# Patient Record
Sex: Male | Born: 1980 | Race: Black or African American | Hispanic: No | Marital: Married | Smoking: Never smoker
Health system: Southern US, Community
[De-identification: ages and names within clinical notes are randomized; demographics above are authoritative.]

## PROBLEM LIST (undated history)

## (undated) DIAGNOSIS — E119 Type 2 diabetes mellitus without complications: Secondary | ICD-10-CM

## (undated) DIAGNOSIS — E66811 Obesity, class 1: Secondary | ICD-10-CM

## (undated) DIAGNOSIS — E669 Obesity, unspecified: Secondary | ICD-10-CM

---

## 2006-04-08 ENCOUNTER — Emergency Department (HOSPITAL_COMMUNITY): Admission: EM | Admit: 2006-04-08 | Discharge: 2006-04-08 | Payer: Self-pay | Admitting: Emergency Medicine

## 2006-10-14 ENCOUNTER — Emergency Department (HOSPITAL_COMMUNITY): Admission: EM | Admit: 2006-10-14 | Discharge: 2006-10-15 | Payer: Self-pay | Admitting: Emergency Medicine

## 2007-03-27 ENCOUNTER — Emergency Department (HOSPITAL_COMMUNITY): Admission: EM | Admit: 2007-03-27 | Discharge: 2007-03-27 | Payer: Self-pay | Admitting: Emergency Medicine

## 2009-04-15 ENCOUNTER — Emergency Department (HOSPITAL_COMMUNITY): Admission: EM | Admit: 2009-04-15 | Discharge: 2009-04-15 | Payer: Self-pay | Admitting: Emergency Medicine

## 2011-10-19 ENCOUNTER — Encounter: Payer: Self-pay | Admitting: *Deleted

## 2011-10-19 ENCOUNTER — Emergency Department (HOSPITAL_COMMUNITY)
Admission: EM | Admit: 2011-10-19 | Discharge: 2011-10-19 | Disposition: A | Discharge status: Home / Self Care | Payer: Self-pay | Attending: Emergency Medicine | Admitting: Emergency Medicine

## 2011-10-19 DIAGNOSIS — R599 Enlarged lymph nodes, unspecified: Secondary | ICD-10-CM | POA: Insufficient documentation

## 2011-10-19 DIAGNOSIS — J029 Acute pharyngitis, unspecified: Secondary | ICD-10-CM | POA: Insufficient documentation

## 2011-10-19 LAB — RAPID STREP SCREEN (MED CTR MEBANE ONLY): Streptococcus, Group A Screen (Direct): NEGATIVE

## 2011-10-19 MED ORDER — HYDROCODONE-ACETAMINOPHEN 5-325 MG PO TABS: 1.0000 | ORAL_TABLET | ORAL | Status: AC | PRN

## 2011-10-19 MED ORDER — DEXAMETHASONE 6 MG PO TABS
12.0000 mg | ORAL_TABLET | Freq: Once | ORAL | Status: AC
  Administered 2011-10-19: 12 mg via ORAL
  Filled 2011-10-19: qty 2

## 2011-10-19 NOTE — ED Provider Notes (Signed)
History     CSN: [id number] Arrival date & time: 10/19/2011  1:24 AM   First MD Initiated Contact with Patient 10/19/11 0715      Chief Complaint  Patient presents with  . Sore Throat    (Consider location/radiation/quality/duration/timing/severity/associated sxs/prior treatment) Patient is a 30 y.o. male presenting with pharyngitis. The history is provided by the patient.  Sore Throat   sore throat started 3 days ago. Pain is moderate to severe currently rated 7/10. Pain is worse when he swallows. Nothing makes it better. He has not done anything to try and help it. There has been associated fever to 101.2. He has not had any chills or sweats. There's been no rhinorrhea. He has had a slight cough which has been nonproductive. There's been mild nausea without vomiting or diarrhea. No arthralgias or myalgias. He denies any sick contacts.  History reviewed. No pertinent past medical history.  History reviewed. No pertinent past surgical history.  History reviewed. No pertinent family history.  History  Substance Use Topics  . Smoking status: Not on file  . Smokeless tobacco: Not on file  . Alcohol Use: Not on file      Review of Systems  All other systems reviewed and are negative.    Allergies  Review of patient's allergies indicates no known allergies.  Home Medications   Current Outpatient Rx  Name Route Sig Dispense Refill  . HYDROCODONE-ACETAMINOPHEN 5-325 MG PO TABS Oral Take 1 tablet by mouth every 4 (four) hours as needed for pain. 12 tablet 0    BP 110/47  Pulse 78  Temp(Src) 99.8 F (37.7 C) (Oral)  Resp 18  Ht 6\' 1"  (1.854 m)  Wt 250 lb (113.399 kg)  BMI 32.98 kg/m2  SpO2 98%  Physical Exam  Nursing note and vitals reviewed.  30 year old male resting comfortably and in no acute distress. Vital signs are significant for low-grade fever of 100.1. Head is normocephalic and atraumatic. PERRLA, EOMI. TMs are clear. Oropharynx is moderate erythema  without exudate or tonsillar hypertrophy. Neck is supple with shotty anterior cervical and submandibular adenopathy. Back is nontender. Lungs are clear without rales, wheezes, rhonchi. Heart has regular rate and rhythm without murmur. There is no chest wall tenderness. Abdomen is soft, flat, nontender without masses or hepatosplenomegaly. Extremities have no cyanosis or edema, full range of motion present. Skin is warm and moist without rash. Neurologic: Mental status is normal, cranial nerves are intact, there are no motor or sensory deficits. Psychiatric: Normal mood and affect.  ED Course  Procedures (including critical care time)   Labs Reviewed  RAPID STREP SCREEN   No results found.  Results for orders placed during the hospital encounter of 10/19/11  RAPID STREP SCREEN      Component Value Range   Streptococcus, Group A Screen (Direct) NEGATIVE  NEGATIVE    No results found.   1. Pharyngitis       MDM  Pharyngitis - probably viral.        Dione Booze, MD 10/19/11 [phone]

## 2011-10-19 NOTE — ED Notes (Signed)
Patient remains in triage at this time. Patient has had a decrease in temperature. Is sleeping. Remains with sore throat

## 2011-10-19 NOTE — ED Notes (Signed)
Pt states that he began to experience a sore throat, headache, neck ache, body aches, fever, and nausea at approximately 0530 yesterday morning.

## 2014-06-24 ENCOUNTER — Emergency Department (HOSPITAL_COMMUNITY): Payer: Worker's Compensation

## 2014-06-24 ENCOUNTER — Emergency Department (HOSPITAL_COMMUNITY)
Admission: EM | Admit: 2014-06-24 | Discharge: 2014-06-24 | Disposition: A | Discharge status: Home / Self Care | Payer: Worker's Compensation | Attending: Emergency Medicine | Admitting: Emergency Medicine

## 2014-06-24 ENCOUNTER — Encounter (HOSPITAL_COMMUNITY): Payer: Self-pay | Admitting: Emergency Medicine

## 2014-06-24 DIAGNOSIS — Y9389 Activity, other specified: Secondary | ICD-10-CM | POA: Insufficient documentation

## 2014-06-24 DIAGNOSIS — W230XXA Caught, crushed, jammed, or pinched between moving objects, initial encounter: Secondary | ICD-10-CM | POA: Diagnosis not present

## 2014-06-24 DIAGNOSIS — S61409A Unspecified open wound of unspecified hand, initial encounter: Secondary | ICD-10-CM | POA: Diagnosis not present

## 2014-06-24 DIAGNOSIS — Y9289 Other specified places as the place of occurrence of the external cause: Secondary | ICD-10-CM | POA: Insufficient documentation

## 2014-06-24 DIAGNOSIS — S61412A Laceration without foreign body of left hand, initial encounter: Secondary | ICD-10-CM

## 2014-06-24 MED ORDER — OXYCODONE-ACETAMINOPHEN 5-325 MG PO TABS
1.0000 | ORAL_TABLET | Freq: Once | ORAL | Status: AC
  Administered 2014-06-24: 1 via ORAL
  Filled 2014-06-24: qty 1

## 2014-06-24 MED ORDER — MORPHINE SULFATE 4 MG/ML IJ SOLN
4.0000 mg | Freq: Once | INTRAMUSCULAR | Status: AC
  Administered 2014-06-24: 4 mg via INTRAVENOUS
  Filled 2014-06-24: qty 1

## 2014-06-24 MED ORDER — SODIUM CHLORIDE 0.9 % IV BOLUS (SEPSIS)
1000.0000 mL | Freq: Once | INTRAVENOUS | Status: AC
  Administered 2014-06-24: 1000 mL via INTRAVENOUS

## 2014-06-24 MED ORDER — LIDOCAINE-EPINEPHRINE 2 %-1:100000 IJ SOLN
20.0000 mL | Freq: Once | INTRAMUSCULAR | Status: AC
  Administered 2014-06-24: 20 mL via INTRADERMAL
  Filled 2014-06-24: qty 1

## 2014-06-24 MED ORDER — LIDOCAINE-EPINEPHRINE-TETRACAINE (LET) SOLUTION: 3.0000 mL | Freq: Once | NASAL | Status: DC

## 2014-06-24 MED ORDER — CEFAZOLIN SODIUM 1-5 GM-% IV SOLN
1.0000 g | Freq: Once | INTRAVENOUS | Status: AC
  Administered 2014-06-24: 1 g via INTRAVENOUS
  Filled 2014-06-24: qty 50

## 2014-06-24 MED ORDER — CEPHALEXIN 250 MG PO CAPS: 250.0000 mg | ORAL_CAPSULE | Freq: Four times a day (QID) | ORAL | Status: DC

## 2014-06-24 MED ORDER — ONDANSETRON HCL 4 MG/2ML IJ SOLN
4.0000 mg | Freq: Once | INTRAMUSCULAR | Status: AC
  Administered 2014-06-24: 4 mg via INTRAVENOUS
  Filled 2014-06-24: qty 2

## 2014-06-24 MED ORDER — HYDROCODONE-ACETAMINOPHEN 5-325 MG PO TABS: 2.0000 | ORAL_TABLET | ORAL | Status: DC | PRN

## 2014-06-24 NOTE — ED Provider Notes (Signed)
CSN: [id number]     Arrival date & time 06/24/14  [id number] History   First MD Initiated Contact with Patient 06/24/14 0140     Chief Complaint  Patient presents with  . Extremity Laceration     (Consider location/radiation/quality/duration/timing/severity/associated sxs/prior Treatment) HPI Comments: Hand was caught in conveyer belt presents with laceration to palm of left hand with uncontrolled bleeding despite pressure   The history is provided by the patient.    History reviewed. No pertinent past medical history. History reviewed. No pertinent past surgical history. No family history on file. History  Substance Use Topics  . Smoking status: Never Smoker   . Smokeless tobacco: Not on file  . Alcohol Use: No    Review of Systems  Skin: Positive for pallor and wound.  Neurological: Positive for dizziness.      Allergies  Review of patient's allergies indicates no known allergies.  Home Medications   Prior to Admission medications   Medication Sig Start Date End Date Taking? Authorizing Provider  cephALEXin (KEFLEX) 250 MG capsule Take 1 capsule (250 mg total) by mouth 4 (four) times daily. 06/24/14   Arman FilterGail K Cyncere Ruhe, NP  HYDROcodone-acetaminophen (NORCO/VICODIN) 5-325 MG per tablet Take 2 tablets by mouth every 4 (four) hours as needed. 06/24/14   Arman FilterGail K Katryn Plummer, NP   BP 137/82  Pulse 71  Resp 16  SpO2 100% Physical Exam  Nursing note reviewed. Constitutional: He is oriented to person, place, and time. He appears well-developed and well-nourished. He appears distressed.  HENT:  Head: Normocephalic.  Eyes: Pupils are equal, round, and reactive to light.  Neck: Normal range of motion.  Cardiovascular: Normal rate and regular rhythm.   Pulmonary/Chest: Effort normal and breath sounds normal.  Musculoskeletal: Normal range of motion. He exhibits tenderness. He exhibits no edema.       Left hand: He exhibits laceration. He exhibits normal range of motion, no tenderness and  normal capillary refill. Normal sensation noted. Normal strength noted.       Hands: Neurological: He is alert and oriented to person, place, and time.  Skin: Skin is warm. He is not diaphoretic.    ED Course  LACERATION REPAIR Date/Time: 06/24/2014 4:36 AM Performed by: Arman FilterSCHULZ, Kilynn Fitzsimmons K Authorized by: Arman FilterSCHULZ, Karleigh Bunte K Consent: Verbal consent obtained. written consent not obtained. Risks and benefits: risks, benefits and alternatives were discussed Consent given by: patient Patient understanding: patient states understanding of the procedure being performed Patient identity confirmed: verbally with patient Time out: Immediately prior to procedure a "time out" was called to verify the correct patient, procedure, equipment, support staff and site/side marked as required. Body area: upper extremity Location details: left hand Laceration length: 6 cm Foreign bodies: no foreign bodies Tendon involvement: none Nerve involvement: none Vascular damage: no Anesthesia: local infiltration Local anesthetic: lidocaine 2% with epinephrine Anesthetic total: 8 ml Patient sedated: no Preparation: Patient was prepped and draped in the usual sterile fashion. Irrigation solution: saline Irrigation method: syringe Amount of cleaning: extensive Debridement: none Degree of undermining: none Skin closure: 3-0 Prolene Subcutaneous closure: 4-0 Vicryl Number of sutures: 13 Technique: simple Approximation: close Approximation difficulty: complex Dressing: antibiotic ointment, 4x4 sterile gauze and gauze roll Patient tolerance: Patient tolerated the procedure well with no immediate complications.   (including critical care time) Labs Review Labs Reviewed - No data to display  Imaging Review Dg Hand Complete Left  06/24/2014   CLINICAL DATA:  Extremity laceration.  EXAM: LEFT HAND - COMPLETE 3+ VIEW  COMPARISON:  Wrist radiographs 04/15/2009  FINDINGS: There is subcutaneous gas involving the palmar  hand correlating with history of laceration. No radiopaque foreign body or fracture. Mild widening between the base of the second and third metacarpals is chronic. Extra density overlapping the distal ulnar metaphysis on the frontal projection is likely artifactual given normal appearance on the remaining views.  IMPRESSION: No acute fracture or radiopaque foreign body.   Electronically Signed   By: Tiburcio Pea M.D.   On: 06/24/2014 02:40     EKG Interpretation None      MDM   Final diagnoses:  Laceration of hand, left, initial encounter   Patient.  Vital signs are stable.  He's been ambulated in the hall has a steady gait.  Dressing has been applied until right as well to limit range of motion.  He is instructed to followup with hand surgery in 2, days.  He's been started on antibiotics, as a prophylactic preventative.  He was given 1 g of Ancef IV in the emergency department.  He is been given strict return guidelines to watch for signs of infection.  Decreased sensation, circulation, or mobility      Arman Filter, NP 06/24/14 0517  Arman Filter, NP 06/24/14 [phone]

## 2014-06-24 NOTE — ED Provider Notes (Signed)
Medical screening examination/treatment/procedure(s) were performed by non-physician practitioner and as supervising physician I was immediately available for consultation/collaboration.   EKG Interpretation None        Carolle Ishii, MD 06/24/14 0703 

## 2014-06-24 NOTE — ED Notes (Signed)
Pt ambulates without difficulty

## 2014-06-24 NOTE — Discharge Instructions (Signed)
You should have the wound should be reexamined in 2-3 days return if you develop any sign of infection

## 2014-06-24 NOTE — ED Notes (Signed)
Pt was at work and got his L hand caught in a drive belt. Laceration to palm of hand. Hand flexion and extension intact.  Pt reports feeling lightheaded while walking.

## 2015-02-27 DIAGNOSIS — R0789 Other chest pain: Secondary | ICD-10-CM | POA: Insufficient documentation

## 2015-02-27 DIAGNOSIS — R079 Chest pain, unspecified: Secondary | ICD-10-CM | POA: Diagnosis present

## 2015-02-27 NOTE — ED Notes (Signed)
Onset today at work.  States bent over and when he stood back up had chest pain.  Has had in past but not this long.  Denies radiation, diaphoresis or vomiting

## 2015-02-28 ENCOUNTER — Emergency Department (HOSPITAL_BASED_OUTPATIENT_CLINIC_OR_DEPARTMENT_OTHER)
Admission: EM | Admit: 2015-02-28 | Discharge: 2015-02-28 | Disposition: A | Discharge status: Home / Self Care | Payer: BLUE CROSS/BLUE SHIELD | Attending: Emergency Medicine | Admitting: Emergency Medicine

## 2015-02-28 ENCOUNTER — Encounter (HOSPITAL_BASED_OUTPATIENT_CLINIC_OR_DEPARTMENT_OTHER): Payer: Self-pay | Admitting: Emergency Medicine

## 2015-02-28 ENCOUNTER — Emergency Department (HOSPITAL_BASED_OUTPATIENT_CLINIC_OR_DEPARTMENT_OTHER): Payer: BLUE CROSS/BLUE SHIELD

## 2015-02-28 DIAGNOSIS — R0789 Other chest pain: Secondary | ICD-10-CM

## 2015-02-28 LAB — CBC WITH DIFFERENTIAL/PLATELET
Basophils Absolute: 0 10*3/uL (ref 0.0–0.1)
Basophils Relative: 0 % (ref 0–1)
EOS ABS: 0.3 10*3/uL (ref 0.0–0.7)
EOS PCT: 6 % — AB (ref 0–5)
HCT: 38.2 % — ABNORMAL LOW (ref 39.0–52.0)
Hemoglobin: 11.9 g/dL — ABNORMAL LOW (ref 13.0–17.0)
LYMPHS ABS: 1.8 10*3/uL (ref 0.7–4.0)
Lymphocytes Relative: 38 % (ref 12–46)
MCH: 23.7 pg — AB (ref 26.0–34.0)
MCHC: 31.2 g/dL (ref 30.0–36.0)
MCV: 76.1 fL — AB (ref 78.0–100.0)
Monocytes Absolute: 0.4 10*3/uL (ref 0.1–1.0)
Monocytes Relative: 8 % (ref 3–12)
Neutro Abs: 2.3 10*3/uL (ref 1.7–7.7)
Neutrophils Relative %: 48 % (ref 43–77)
PLATELETS: 250 10*3/uL (ref 150–400)
RBC: 5.02 MIL/uL (ref 4.22–5.81)
RDW: 13.5 % (ref 11.5–15.5)
WBC: 4.7 10*3/uL (ref 4.0–10.5)

## 2015-02-28 LAB — BASIC METABOLIC PANEL
ANION GAP: 6 (ref 5–15)
BUN: 14 mg/dL (ref 6–23)
CALCIUM: 9.3 mg/dL (ref 8.4–10.5)
CO2: 28 mmol/L (ref 19–32)
CREATININE: 0.87 mg/dL (ref 0.50–1.35)
Chloride: 108 mmol/L (ref 96–112)
Glucose, Bld: 112 mg/dL — ABNORMAL HIGH (ref 70–99)
Potassium: 3.6 mmol/L (ref 3.5–5.1)
SODIUM: 142 mmol/L (ref 135–145)

## 2015-02-28 LAB — TROPONIN I: Troponin I: <0.03 ng/mL (ref <0.031)

## 2015-02-28 NOTE — ED Notes (Signed)
Pt alert x4 respirations easy non labored.  

## 2015-02-28 NOTE — ED Provider Notes (Signed)
CSN: [id number]     Arrival date & time 02/27/15  2322 History   First MD Initiated Contact with Patient 02/28/15 0239     Chief Complaint  Patient presents with  . Chest Pain     (Consider location/radiation/quality/duration/timing/severity/associated sxs/prior Treatment) HPI  This is a 34 year old male who was at work yesterday morning when he lifted something heavy. This caused him to have a sudden, sharp, moderately severe pain to the left of his sternum. The pain radiated laterally. It was worse with movement or breathing. It resolved after several minutes and he is pain-free. He has had a similar pain in the past but never has lasted this long or been this severe. There was no associated nausea or diaphoresis.  History reviewed. No pertinent past medical history. History reviewed. No pertinent past surgical history. No family history on file. History  Substance Use Topics  . Smoking status: Never Smoker   . Smokeless tobacco: Not on file  . Alcohol Use: No    Review of Systems  All other systems reviewed and are negative.   Allergies  Review of patient's allergies indicates no known allergies.  Home Medications   Prior to Admission medications   Not on File   BP 132/77 mmHg  Pulse 68  Temp(Src) 98.7 F (37.1 C) (Oral)  Resp 20  Ht 6' (1.829 m)  Wt 228 lb (103.42 kg)  BMI 30.92 kg/m2  SpO2 98%   Physical Exam  General: Well-developed, well-nourished male in no acute distress; appearance consistent with age of record HENT: normocephalic; atraumatic Eyes: pupils equal, round and reactive to light; extraocular muscles intact Neck: supple Heart: regular rate and rhythm Lungs: clear to auscultation bilaterally Chest: Nontender Abdomen: soft; nondistended; nontender; bowel sounds present Extremities: No deformity; full range of motion; pulses normal Neurologic: Awake, alert and oriented; motor function intact in all extremities and symmetric; no facial  droop Skin: Warm and dry Psychiatric: Normal mood and affect    ED Course  Procedures (including critical care time)   MDM   Nursing notes and vitals signs, including pulse oximetry, reviewed.  Summary of this visit's results, reviewed by myself:   EKG Interpretation  Date/Time:  Wednesday February 28 2015 00:02:08 EDT Ventricular Rate:  69 PR Interval:  184 QRS Duration: 100 QT Interval:  378 QTC Calculation: 405 R Axis:   67 Text Interpretation:  Normal sinus rhythm Normal ECG No previous ECGs available Confirmed by Maiah Sinning  MD, Jonny RuizJOHN ([id number]) on 02/28/2015 2:40:19 AM       Labs:  Results for orders placed or performed during the hospital encounter of 02/28/15 (from the past 24 hour(s))  Basic metabolic panel     Status: Abnormal   Collection Time: 02/28/15  1:40 AM  Result Value Ref Range   Sodium 142 135 - 145 mmol/L   Potassium 3.6 3.5 - 5.1 mmol/L   Chloride 108 96 - 112 mmol/L   CO2 28 19 - 32 mmol/L   Glucose, Bld 112 (H) 70 - 99 mg/dL   BUN 14 6 - 23 mg/dL   Creatinine, Ser 1.910.87 0.50 - 1.35 mg/dL   Calcium 9.3 8.4 - 47.810.5 mg/dL   GFR calc non Af Amer >90 >90 mL/min   GFR calc Af Amer >90 >90 mL/min   Anion gap 6 5 - 15  Troponin I (MHP)     Status: None   Collection Time: 02/28/15  1:40 AM  Result Value Ref Range   Troponin I <0.03 <0.031  ng/mL  CBC with Differential     Status: Abnormal   Collection Time: 02/28/15  1:40 AM  Result Value Ref Range   WBC 4.7 4.0 - 10.5 K/uL   RBC 5.02 4.22 - 5.81 MIL/uL   Hemoglobin 11.9 (L) 13.0 - 17.0 g/dL   HCT 56.3 (L) 87.5 - 64.3 %   MCV 76.1 (L) 78.0 - 100.0 fL   MCH 23.7 (L) 26.0 - 34.0 pg   MCHC 31.2 30.0 - 36.0 g/dL   RDW 32.9 51.8 - 84.1 %   Platelets 250 150 - 400 K/uL   Neutrophils Relative % 48 43 - 77 %   Neutro Abs 2.3 1.7 - 7.7 K/uL   Lymphocytes Relative 38 12 - 46 %   Lymphs Abs 1.8 0.7 - 4.0 K/uL   Monocytes Relative 8 3 - 12 %   Monocytes Absolute 0.4 0.1 - 1.0 K/uL   Eosinophils Relative 6 (H) 0  - 5 %   Eosinophils Absolute 0.3 0.0 - 0.7 K/uL   Basophils Relative 0 0 - 1 %   Basophils Absolute 0.0 0.0 - 0.1 K/uL    Imaging Studies: Dg Chest 2 View  02/28/2015   CLINICAL DATA:  Chest pain and upper back pain.  Initial encounter.  EXAM: CHEST  2 VIEW  COMPARISON:  None.  FINDINGS: The lungs are well-aerated and clear. There is no evidence of focal opacification, pleural effusion or pneumothorax.  The heart is normal in size; the mediastinal contour is within normal limits. No acute osseous abnormalities are seen.  IMPRESSION: No acute cardiopulmonary process seen. No displaced rib fractures identified.   Electronically Signed   By: Roanna Raider M.D.   On: 02/28/2015 02:14   History consistent with transient chest wall pain.      Paula Libra, MD 02/28/15 [phone]

## 2016-11-06 ENCOUNTER — Encounter (HOSPITAL_COMMUNITY): Payer: Self-pay | Admitting: Emergency Medicine

## 2016-11-06 ENCOUNTER — Ambulatory Visit (HOSPITAL_COMMUNITY)
Admission: EM | Admit: 2016-11-06 | Discharge: 2016-11-06 | Disposition: A | Discharge status: Home / Self Care | Payer: BLUE CROSS/BLUE SHIELD | Attending: Emergency Medicine | Admitting: Emergency Medicine

## 2016-11-06 DIAGNOSIS — B349 Viral infection, unspecified: Secondary | ICD-10-CM | POA: Diagnosis not present

## 2016-11-06 DIAGNOSIS — J9801 Acute bronchospasm: Secondary | ICD-10-CM

## 2016-11-06 DIAGNOSIS — J069 Acute upper respiratory infection, unspecified: Secondary | ICD-10-CM

## 2016-11-06 DIAGNOSIS — R0982 Postnasal drip: Secondary | ICD-10-CM | POA: Diagnosis not present

## 2016-11-06 MED ORDER — ONDANSETRON HCL 4 MG PO TABS: 4.0000 mg | ORAL_TABLET | Freq: Four times a day (QID) | ORAL | 0 refills | Status: DC

## 2016-11-06 MED ORDER — PHENYLEPHRINE-CHLORPHEN-DM 10-4-12.5 MG/5ML PO LIQD: 5.0000 mL | ORAL | 0 refills | Status: DC | PRN

## 2016-11-06 MED ORDER — ALBUTEROL SULFATE HFA 108 (90 BASE) MCG/ACT IN AERS: 2.0000 | INHALATION_SPRAY | RESPIRATORY_TRACT | 0 refills | Status: DC | PRN

## 2016-11-06 NOTE — ED Provider Notes (Signed)
CSN: [id number]     Arrival date & time 11/06/16  1925 History   First MD Initiated Contact with Patient 11/06/16 2010     Chief Complaint  Patient presents with  . URI   (Consider location/radiation/quality/duration/timing/severity/associated sxs/prior Treatment) 36 year old male complaining of cough for 4-5 days associated with headaches, chest congestion, PND, sore throat and subjective fevers undocumented. His also complaining of generalized abdominal discomfort. He has some nausea but no vomiting.      History reviewed. No pertinent past medical history. History reviewed. No pertinent surgical history. History reviewed. No pertinent family history. Social History  Substance Use Topics  . Smoking status: Never Smoker  . Smokeless tobacco: Never Used  . Alcohol use No    Review of Systems  Constitutional: Positive for activity change and fever. Negative for diaphoresis and fatigue.  HENT: Positive for congestion, postnasal drip, rhinorrhea and sore throat. Negative for ear pain, facial swelling and trouble swallowing.   Eyes: Negative for pain, discharge and redness.  Respiratory: Positive for cough. Negative for chest tightness and shortness of breath.   Cardiovascular: Negative.   Gastrointestinal: Positive for nausea.       As per history of present illness  Genitourinary: Negative.   Musculoskeletal: Negative.  Negative for neck pain and neck stiffness.  Skin: Negative.   Neurological: Negative.     Allergies  Patient has no known allergies.  Home Medications   Prior to Admission medications   Medication Sig Start Date End Date Taking? Authorizing Provider  albuterol (PROVENTIL HFA;VENTOLIN HFA) 108 (90 Base) MCG/ACT inhaler Inhale 2 puffs into the lungs every 4 (four) hours as needed for wheezing or shortness of breath. 11/06/16   Hayden Rasmussen, NP  ondansetron (ZOFRAN) 4 MG tablet Take 1 tablet (4 mg total) by mouth every 6 (six) hours. Prn nausea 11/06/16   Hayden Rasmussen,  NP  Phenylephrine-Chlorphen-DM 08-07-11.5 MG/5ML LIQD Take 5 mLs by mouth every 4 (four) hours as needed. For congestion and drainage 11/06/16   Hayden Rasmussen, NP   Meds Ordered and Administered this Visit  Medications - No data to display  BP 113/72 (BP Location: Left Arm)   Pulse 81   Temp 100.5 F (38.1 C) (Oral)   Resp 20   SpO2 99%  No data found.   Physical Exam  Constitutional: He is oriented to person, place, and time. He appears well-developed and well-nourished. No distress.  HENT:  Right Ear: External ear normal.  Left Ear: External ear normal.  Mouth/Throat: No oropharyngeal exudate.  Bilateral TMs are normal. Oropharynx with light erythema and moderate amount of thick clear PND.  Eyes: EOM are normal. Pupils are equal, round, and reactive to light.  Neck: Normal range of motion. Neck supple.  Cardiovascular: Normal rate, regular rhythm and normal heart sounds.   Pulmonary/Chest: Effort normal and breath sounds normal. No respiratory distress. He has no rales.  With tidal volume lungs are clear. With forced cough there is bilateral diffuse coarseness.  Abdominal: Soft. Bowel sounds are normal.  Minor generalized tenderness with deep palpation otherwise normal abdomen.  Musculoskeletal: Normal range of motion. He exhibits no edema.  Lymphadenopathy:    He has no cervical adenopathy.  Neurological: He is alert and oriented to person, place, and time.  Skin: Skin is warm and dry. No rash noted.  Psychiatric: He has a normal mood and affect.  Nursing note and vitals reviewed.   Urgent Care Course   Clinical Course     Procedures (including  critical care time)  Labs Review Labs Reviewed - No data to display  Imaging Review No results found.   Visual Acuity Review  Right Eye Distance:   Left Eye Distance:   Bilateral Distance:    Right Eye Near:   Left Eye Near:    Bilateral Near:         MDM   1. Acute upper respiratory infection   2. Viral  syndrome   3. Cough due to bronchospasm   4. PND (post-nasal drip)    Take medications as prescribed for your symptoms. He had minor wheezing and the albuterol inhaler 2 puffs every 4 hours can help with wheezing and cough. He can take Tylenol or ibuprofen as needed for any fevers, aches or pains. Use saline nasal spray frequently for nasal congestion. Cepacol lozenges for sore throat. Drink lots of clear liquids. Do not fill up with solid foods, fast food or greasy foods. give your stomach a rest. If you develop worsening abdominal pain, persistent vomiting seek medical attention promptly. Meds ordered this encounter  Medications  . albuterol (PROVENTIL HFA;VENTOLIN HFA) 108 (90 Base) MCG/ACT inhaler    Sig: Inhale 2 puffs into the lungs every 4 (four) hours as needed for wheezing or shortness of breath.    Dispense:  1 Inhaler    Refill:  0    Order Specific Question:   Supervising Provider    Answer:   Domenick GongMORTENSON, ASHLEY [[id number]]  . ondansetron (ZOFRAN) 4 MG tablet    Sig: Take 1 tablet (4 mg total) by mouth every 6 (six) hours. Prn nausea    Dispense:  12 tablet    Refill:  0    Order Specific Question:   Supervising Provider    Answer:   Domenick GongMORTENSON, ASHLEY [[id number]]  . Phenylephrine-Chlorphen-DM 08-07-11.5 MG/5ML LIQD    Sig: Take 5 mLs by mouth every 4 (four) hours as needed. For congestion and drainage    Dispense:  120 mL    Refill:  0    Order Specific Question:   Supervising Provider    Answer:   Domenick GongMORTENSON, ASHLEY [[id number]]       Hayden Rasmussenavid Iylah Dworkin, NP 11/06/16 2033

## 2016-11-06 NOTE — Discharge Instructions (Signed)
Take medications as prescribed for your symptoms. He had minor wheezing and the albuterol inhaler 2 puffs every 4 hours can help with wheezing and cough. He can take Tylenol or ibuprofen as needed for any fevers, aches or pains. Use saline nasal spray frequently for nasal congestion. Cepacol lozenges for sore throat. Drink lots of clear liquids. Do not fill up with solid foods, fast food or greasy foods. give your stomach a rest. If you develop worsening abdominal pain, persistent vomiting seek medical attention promptly.

## 2016-11-06 NOTE — ED Triage Notes (Signed)
Here for cold sx onset 1 week associated w/prod cough, abd pain, BA  Taking Robitussin w/no relief.   A&O x4... NAD

## 2016-11-11 ENCOUNTER — Encounter (HOSPITAL_BASED_OUTPATIENT_CLINIC_OR_DEPARTMENT_OTHER): Payer: Self-pay

## 2016-11-11 ENCOUNTER — Emergency Department (HOSPITAL_BASED_OUTPATIENT_CLINIC_OR_DEPARTMENT_OTHER): Payer: BLUE CROSS/BLUE SHIELD

## 2016-11-11 ENCOUNTER — Emergency Department (HOSPITAL_BASED_OUTPATIENT_CLINIC_OR_DEPARTMENT_OTHER)
Admission: EM | Admit: 2016-11-11 | Discharge: 2016-11-11 | Disposition: A | Discharge status: Home / Self Care | Payer: BLUE CROSS/BLUE SHIELD | Attending: Emergency Medicine | Admitting: Emergency Medicine

## 2016-11-11 DIAGNOSIS — J069 Acute upper respiratory infection, unspecified: Secondary | ICD-10-CM | POA: Diagnosis not present

## 2016-11-11 DIAGNOSIS — R05 Cough: Secondary | ICD-10-CM | POA: Diagnosis present

## 2016-11-11 MED ORDER — OPTICHAMBER DIAMOND-LG MASK DEVI: 1.0000 | Freq: Once | 0 refills | Status: AC

## 2016-11-11 MED ORDER — BENZONATATE 100 MG PO CAPS: 100.0000 mg | ORAL_CAPSULE | Freq: Three times a day (TID) | ORAL | 0 refills | Status: DC | PRN

## 2016-11-11 MED ORDER — AZITHROMYCIN 250 MG PO TABS: 250.0000 mg | ORAL_TABLET | Freq: Every day | ORAL | 0 refills | Status: DC

## 2016-11-11 MED ORDER — ALBUTEROL SULFATE HFA 108 (90 BASE) MCG/ACT IN AERS: 1.0000 | INHALATION_SPRAY | Freq: Four times a day (QID) | RESPIRATORY_TRACT | 0 refills | Status: DC | PRN

## 2016-11-11 NOTE — ED Provider Notes (Signed)
[facility]   CSN: [id number] Arrival date & time: 11/11/16  1217     History   Chief Complaint Chief Complaint  Patient presents with  . Cough    [id number] [name] is a 36 y.o. male.  HPI   Patient presents with 5 day history of productive cough, nasal congestion, myalgias, and subjective fever.  He states [facility] 5 days ago when it began, but he has not been able to fill the prescriptions (albuterol, zofran, and sudafed).  Wife and patient state that the pharmacy stated they did not have those prescriptions and would not be able to fill them.  No CP, SOB, or abdominal pain.  No sore throat, otalgia.  He has been taking Robitussin with minimal relief. Wife sick with similar symptoms.   History reviewed. No pertinent past medical history.  There are no active problems to display for this patient.   History reviewed. No pertinent surgical history.     Home Medications    Prior to Admission medications   Medication Sig Start Date End Date Taking? Authorizing Provider  albuterol (PROVENTIL HFA;VENTOLIN HFA) 108 (90 Base) MCG/ACT inhaler Inhale 1-2 puffs into the lungs every 6 (six) hours as needed for wheezing or shortness of breath. 11/11/16   Cheri FowlerKayla Ashley Bultema, PA-C  azithromycin (ZITHROMAX) 250 MG tablet Take 1 tablet (250 mg total) by mouth daily. Take first 2 tablets together, then 1 every day until finished. 11/13/16   Danaysia Rader, PA-C  benzonatate (TESSALON) 100 MG capsule Take 1 capsule (100 mg total) by mouth 3 (three) times daily as needed for cough. 11/11/16   Olene Godfrey, PA-C  ondansetron (ZOFRAN) 4 MG tablet Take 1 tablet (4 mg total) by mouth every 6 (six) hours. Prn nausea 11/06/16   Hayden Rasmussenavid Mabe, NP  Phenylephrine-Chlorphen-DM 08-07-11.5 MG/5ML LIQD Take 5 mLs by mouth every 4 (four) hours as needed. For congestion and drainage 11/06/16   Hayden Rasmussenavid Mabe, NP  Spacer/Aero-Holding Chambers (OPTICHAMBER DIAMOND-LG MASK) DEVI 1 Device by  Does not apply route once. 11/11/16 11/11/16  Cheri FowlerKayla Briunna Leicht, PA-C    Family History No family history on file.  Social History Social History  Substance Use Topics  . Smoking status: Never Smoker  . Smokeless tobacco: Never Used  . Alcohol use No     Allergies   Patient has no known allergies.   Review of Systems Review of Systems All other systems negative unless otherwise stated in HPI   Physical Exam Updated Vital Signs BP 122/70 (BP Location: Right Arm)   Pulse 70   Temp 98.7 F (37.1 C) (Oral)   Resp 20   Ht 6\' 1"  (1.854 m)   Wt 107 kg   SpO2 98%   BMI 31.14 kg/m   Physical Exam  Constitutional: He is oriented to person, place, and time. He appears well-developed and well-nourished. He is active.  Non-toxic appearance. He does not have a sickly appearance. He does not appear ill.  HENT:  Head: Normocephalic and atraumatic.  Right Ear: Tympanic membrane and external ear normal. Tympanic membrane is not erythematous and not bulging.  Left Ear: Tympanic membrane and external ear normal. Tympanic membrane is not erythematous and not bulging.  Nose: Nose normal.  Mouth/Throat: Uvula is midline, oropharynx is clear and moist and mucous membranes are normal. No trismus in the jaw. No uvula swelling. No oropharyngeal exudate, posterior oropharyngeal edema, posterior oropharyngeal erythema or tonsillar abscesses.  Neck: Normal range  of motion. Neck supple.  No nuchal rigidity.   Cardiovascular: Normal rate and regular rhythm.   Pulmonary/Chest: Effort normal. No respiratory distress. He has no wheezes. He has rhonchi (that clear with cough). He has no rales.  Abdominal: Soft. Bowel sounds are normal. He exhibits no distension. There is no tenderness.  Musculoskeletal: Normal range of motion.  Lymphadenopathy:    He has no cervical adenopathy.  Neurological: He is alert and oriented to person, place, and time.  Skin: Skin is warm and dry.  Psychiatric: He has a normal mood  and affect. His behavior is normal.     ED Treatments / Results  Labs (all labs ordered are listed, but only abnormal results are displayed) Labs Reviewed - No data to display  EKG  EKG Interpretation None       Radiology Dg Chest 2 View  Result Date: 11/11/2016 CLINICAL DATA:  Cough, fever, headache EXAM: CHEST  2 VIEW COMPARISON:  Chest x-ray of 02/28/2015 FINDINGS: No active infiltrate or effusion is seen. Mediastinal and hilar contours are unremarkable. The heart is within normal limits in size. No bony abnormality is seen. IMPRESSION: No active cardiopulmonary disease. Electronically Signed   By: Dwyane Dee M.D.   On: 11/11/2016 14:56    Procedures Procedures (including critical care time)  Medications Ordered in ED Medications - No data to display   Initial Impression / Assessment and Plan / ED Course  I have reviewed the triage vital signs and the nursing notes.  Pertinent labs & imaging results that were available during my care of the patient were reviewed by me and considered in my medical decision making (see chart for details).  Clinical Course    Pt symptoms consistent with URI. CXR negative for acute infiltrate. Pt will be discharged with symptomatic treatment.  No PCP.  Future dated prescription for azithromycin and instructed the patient to fill this if cough continues, voiced understanding.  Discussed return precautions.  Pt is hemodynamically stable & in NAD prior to discharge.   Final Clinical Impressions(s) / ED Diagnoses   Final diagnoses:  Upper respiratory tract infection, unspecified type    New Prescriptions New Prescriptions   ALBUTEROL (PROVENTIL HFA;VENTOLIN HFA) 108 (90 BASE) MCG/ACT INHALER    Inhale 1-2 puffs into the lungs every 6 (six) hours as needed for wheezing or shortness of breath.   AZITHROMYCIN (ZITHROMAX) 250 MG TABLET    Take 1 tablet (250 mg total) by mouth daily. Take first 2 tablets together, then 1 every day until  finished.   BENZONATATE (TESSALON) 100 MG CAPSULE    Take 1 capsule (100 mg total) by mouth 3 (three) times daily as needed for cough.   SPACER/AERO-HOLDING CHAMBERS (OPTICHAMBER DIAMOND-LG MASK) DEVI    1 Device by Does not apply route once.     Cheri Fowler, PA-C 11/11/16 1524    Nira Conn, MD 11/12/16 1141

## 2016-11-11 NOTE — ED Triage Notes (Addendum)
C/o cough x 5 days-was seen at urgent care for same-did not fill rx-NAD-steady gait-wife states she took rx x 3 to [facility] and they did not have cough med-she has rx x 3

## 2016-11-11 NOTE — Discharge Instructions (Signed)
Your chest xray today is normal.  Your symptoms are likely viral.  Take tessalon perles and use the albuterol inhaler.  Fill the azithromycin prescription in 2-3 days for continued symptoms.  Return to the ED for any new or concerning symptoms.

## 2017-08-13 ENCOUNTER — Ambulatory Visit (HOSPITAL_COMMUNITY)
Admission: EM | Admit: 2017-08-13 | Discharge: 2017-08-13 | Disposition: A | Discharge status: Home / Self Care | Payer: BLUE CROSS/BLUE SHIELD | Attending: Family Medicine | Admitting: Family Medicine

## 2017-08-13 ENCOUNTER — Encounter (HOSPITAL_COMMUNITY): Payer: Self-pay | Admitting: Emergency Medicine

## 2017-08-13 DIAGNOSIS — J00 Acute nasopharyngitis [common cold]: Secondary | ICD-10-CM | POA: Diagnosis not present

## 2017-08-13 MED ORDER — CETIRIZINE-PSEUDOEPHEDRINE ER 5-120 MG PO TB12: 1.0000 | ORAL_TABLET | Freq: Every day | ORAL | 0 refills | Status: DC

## 2017-08-13 MED ORDER — BENZONATATE 100 MG PO CAPS: 100.0000 mg | ORAL_CAPSULE | Freq: Three times a day (TID) | ORAL | 0 refills | Status: DC

## 2017-08-13 MED ORDER — FLUTICASONE PROPIONATE 50 MCG/ACT NA SUSP: 2.0000 | Freq: Every day | NASAL | 0 refills | Status: DC

## 2017-08-13 NOTE — ED Triage Notes (Signed)
PT reports cough, sneezing, congestion, sore throat that started yesterday. PT reports one episode of diarrhea.

## 2017-08-13 NOTE — Discharge Instructions (Signed)
Tessalon for cough. Start flonase, zyrtec-D for nasal congestion. You can use over the counter nasal saline rinse such as neti pot for nasal congestion. Keep hydrated, your urine should be clear to pale yellow in color. Bland diet as attached, advance as tolerated. Probiotics after diarrhea resolves.Tylenol/motrin for fever and pain. Monitor for any worsening of symptoms, chest pain, shortness of breath, wheezing, swelling of the throat, follow up for reevaluation.   For sore throat try using a honey-based tea. Use 3 teaspoons of honey with juice squeezed from half lemon. Place shaved pieces of ginger into 1/2-1 cup of water and warm over stove top. Then mix the ingredients and repeat every 4 hours as needed.

## 2017-08-13 NOTE — ED Notes (Signed)
Pt discharged by provider Linward Headland, PA

## 2017-08-13 NOTE — ED Provider Notes (Signed)
[facility]    CSN: [id number] Arrival date & time: 08/13/17  1651     History   Chief Complaint Chief Complaint  Patient presents with  . URI    HPI Louis Vargas is a 36 y.o. male.   36 year old male with 1 day history of cough, sneezing, nasal and chest congestion, sore throat. He had 1 episode of diarrhea today. Also experiencing some headache. Denies fever, chills, night sweats. Denies ear pain, eye pain. Productive cough. Some generalized abdominal pain, without nausea/vomiting. Denies chest pain, shortness of breath. States he thinks he is wheezing due to nasal congestion. Has not tried anything for his symptoms. Denies sick contact.       History reviewed. No pertinent past medical history.  There are no active problems to display for this patient.   History reviewed. No pertinent surgical history.     Home Medications    Prior to Admission medications   Medication Sig Start Date End Date Taking? Authorizing Provider  benzonatate (TESSALON) 100 MG capsule Take 1 capsule (100 mg total) by mouth every 8 (eight) hours. 08/13/17   Shariece Viveiros V, PA-C  cetirizine-pseudoephedrine (ZYRTEC-D) 5-120 MG tablet Take 1 tablet by mouth daily. 08/13/17   Cathie Hoops, Fermin Yan V, PA-C  fluticasone (FLONASE) 50 MCG/ACT nasal spray Place 2 sprays into both nostrils daily. 08/13/17   Belinda Fisher, PA-C    Family History No family history on file.  Social History Social History  Substance Use Topics  . Smoking status: Never Smoker  . Smokeless tobacco: Never Used  . Alcohol use Yes     Comment: occ.      Allergies   Patient has no known allergies.   Review of Systems Review of Systems  Reason unable to perform ROS: See HPI as above.     Physical Exam Triage Vital Signs ED Triage Vitals  Enc Vitals Group     BP 08/13/17 1710 (!) 132/97     Pulse Rate 08/13/17 1710 77     Resp 08/13/17 1710 16     Temp 08/13/17 1710 98.6 F (37 C)     Temp Source 08/13/17 1710  Oral     SpO2 08/13/17 1710 97 %     Weight 08/13/17 1709 250 lb (113.4 kg)     Height 08/13/17 1709 6' (1.829 m)     Head Circumference --      Peak Flow --      Pain Score 08/13/17 1709 8     Pain Loc --      Pain Edu? --      Excl. in GC? --    No data found.   Updated Vital Signs BP (!) 132/97   Pulse 77   Temp 98.6 F (37 C) (Oral)   Resp 16   Ht 6' (1.829 m)   Wt 250 lb (113.4 kg)   SpO2 97%   BMI 33.91 kg/m   Physical Exam  Constitutional: He is oriented to person, place, and time. He appears well-developed and well-nourished. No distress.  HENT:  Head: Normocephalic and atraumatic.  Right Ear: External ear and ear canal normal. Tympanic membrane is erythematous. Tympanic membrane is not bulging.  Left Ear: External ear and ear canal normal.  Nose: Mucosal edema and rhinorrhea present. Right sinus exhibits frontal sinus tenderness. Right sinus exhibits no maxillary sinus tenderness. Left sinus exhibits frontal sinus tenderness. Left sinus exhibits no maxillary sinus tenderness.  Mouth/Throat: Uvula is midline, oropharynx  is clear and moist and mucous membranes are normal.  Left ear canal with cerumen impaction, TM not visible.   Eyes: Pupils are equal, round, and reactive to light. Conjunctivae are normal.  Neck: Normal range of motion. Neck supple.  Cardiovascular: Normal rate, regular rhythm and normal heart sounds.  Exam reveals no gallop and no friction rub.   No murmur heard. Pulmonary/Chest: Effort normal and breath sounds normal. He has no decreased breath sounds. He has no wheezes. He has no rhonchi. He has no rales.  Abdominal: Soft. Bowel sounds are normal. He exhibits no mass. There is no tenderness. There is no rebound and no guarding.  Lymphadenopathy:    He has no cervical adenopathy.  Neurological: He is alert and oriented to person, place, and time.  Skin: Skin is warm and dry.  Psychiatric: He has a normal mood and affect. His behavior is normal.  Judgment normal.     UC Treatments / Results  Labs (all labs ordered are listed, but only abnormal results are displayed) Labs Reviewed - No data to display  EKG  EKG Interpretation None       Radiology No results found.  Procedures Procedures (including critical care time)  Medications Ordered in UC Medications - No data to display   Initial Impression / Assessment and Plan / UC Course  I have reviewed the triage vital signs and the nursing notes.  Pertinent labs & imaging results that were available during my care of the patient were reviewed by me and considered in my medical decision making (see chart for details).    Discussed with patient history and exam most consistent with viral URI. No alarming signs on exam. Symptomatic treatment as needed. Push fluids. Bland diet, advance as tolerated. Return precautions given.    Final Clinical Impressions(s) / UC Diagnoses   Final diagnoses:  Acute nasopharyngitis    New Prescriptions New Prescriptions   BENZONATATE (TESSALON) 100 MG CAPSULE    Take 1 capsule (100 mg total) by mouth every 8 (eight) hours.   CETIRIZINE-PSEUDOEPHEDRINE (ZYRTEC-D) 5-120 MG TABLET    Take 1 tablet by mouth daily.   FLUTICASONE (FLONASE) 50 MCG/ACT NASAL SPRAY    Place 2 sprays into both nostrils daily.       Belinda Fisher, PA-C 08/13/17 1727

## 2018-10-20 ENCOUNTER — Emergency Department (HOSPITAL_BASED_OUTPATIENT_CLINIC_OR_DEPARTMENT_OTHER)
Admission: EM | Admit: 2018-10-20 | Discharge: 2018-10-20 | Disposition: A | Discharge status: Home / Self Care | Payer: BLUE CROSS/BLUE SHIELD | Attending: Emergency Medicine | Admitting: Emergency Medicine

## 2018-10-20 ENCOUNTER — Encounter (HOSPITAL_BASED_OUTPATIENT_CLINIC_OR_DEPARTMENT_OTHER): Payer: Self-pay

## 2018-10-20 ENCOUNTER — Other Ambulatory Visit: Payer: Self-pay

## 2018-10-20 DIAGNOSIS — B9789 Other viral agents as the cause of diseases classified elsewhere: Secondary | ICD-10-CM | POA: Insufficient documentation

## 2018-10-20 DIAGNOSIS — J069 Acute upper respiratory infection, unspecified: Secondary | ICD-10-CM

## 2018-10-20 DIAGNOSIS — R05 Cough: Secondary | ICD-10-CM | POA: Diagnosis present

## 2018-10-20 MED ORDER — FLUTICASONE PROPIONATE 50 MCG/ACT NA SUSP: 2.0000 | Freq: Every day | NASAL | 0 refills | Status: DC

## 2018-10-20 MED ORDER — CETIRIZINE-PSEUDOEPHEDRINE ER 5-120 MG PO TB12: 1.0000 | ORAL_TABLET | Freq: Every day | ORAL | 0 refills | Status: DC

## 2018-10-20 MED ORDER — BENZONATATE 200 MG PO CAPS
200.0000 mg | ORAL_CAPSULE | Freq: Three times a day (TID) | ORAL | 0 refills | Status: AC
Start: 2018-10-20 — End: 2018-10-30

## 2018-10-20 MED ORDER — BENZONATATE 200 MG PO CAPS: 200.0000 mg | ORAL_CAPSULE | Freq: Three times a day (TID) | ORAL | 0 refills | Status: DC

## 2018-10-20 NOTE — ED Triage Notes (Signed)
C/o flu like sx x 4 days-NAD-steady gait 

## 2018-10-20 NOTE — ED Provider Notes (Signed)
[facility] EMERGENCY DEPARTMENT Provider Note   CSN: [id number] Arrival date & time: 10/20/18  1211     History   Chief Complaint Chief Complaint  Patient presents with  . Cough    HPI Louis Vargas is a 37 y.o. male.  37 year old male presents with complaint of nonproductive cough, feeling hot and cold, body aches, sore throat, sinus congestion x3 days.  Patient was exposed to his significant other who has similar symptoms.  Patient is using Hall's cough drops and Vicks VapoRub with limited relief of his symptoms.  Patient is a non-smoker, states he may have had asthma in the past but no confirmed diagnosis and does not currently require treatment, does not vape.  No other complaints or concerns today.     History reviewed. No pertinent past medical history.  There are no active problems to display for this patient.   History reviewed. No pertinent surgical history.      Home Medications    Prior to Admission medications   Medication Sig Start Date End Date Taking? Authorizing Provider  benzonatate (TESSALON) 200 MG capsule Take 1 capsule (200 mg total) by mouth every 8 (eight) hours for 10 days. 10/20/18 10/30/18  Jeannie FendMurphy, Wendal Wilkie A, PA-C  cetirizine-pseudoephedrine (ZYRTEC-D) 5-120 MG tablet Take 1 tablet by mouth daily. 10/20/18   Jeannie FendMurphy, Sahasra Belue A, PA-C  fluticasone (FLONASE) 50 MCG/ACT nasal spray Place 2 sprays into both nostrils daily. 10/20/18   Jeannie FendMurphy, Jossie Smoot A, PA-C    Family History No family history on file.  Social History Social History   Tobacco Use  . Smoking status: Never Smoker  . Smokeless tobacco: Never Used  Substance Use Topics  . Alcohol use: Yes    Comment: occ  . Drug use: No     Allergies   Ivp dye [iodinated diagnostic agents]   Review of Systems Review of Systems  Constitutional: Positive for chills and fever.  HENT: Positive for congestion, postnasal drip and sore throat. Negative for ear pain, sinus pressure and  sinus pain.   Eyes: Negative for discharge and redness.  Respiratory: Positive for cough. Negative for shortness of breath and wheezing.   Cardiovascular: Negative for chest pain.  Gastrointestinal: Negative for constipation, diarrhea, nausea and vomiting.  Musculoskeletal: Positive for arthralgias and myalgias. Negative for joint swelling.  Skin: Negative for rash and wound.  Allergic/Immunologic: Negative for immunocompromised state.  Neurological: Negative for weakness and headaches.  Hematological: Negative for adenopathy.  Psychiatric/Behavioral: Negative for confusion.  All other systems reviewed and are negative.    Physical Exam Updated Vital Signs BP 123/74 (BP Location: Left Arm)   Pulse 64   Temp 98.3 F (36.8 C) (Oral)   Resp 16   Ht 6' (1.829 m)   Wt 114.7 kg   SpO2 97%   BMI 34.30 kg/m   Physical Exam Vitals signs and nursing note reviewed.  Constitutional:      General: He is not in acute distress.    Appearance: He is well-developed. He is not ill-appearing or toxic-appearing.  HENT:     Head: Normocephalic and atraumatic.     Right Ear: Hearing, tympanic membrane and ear canal normal. No middle ear effusion.     Left Ear: Hearing, tympanic membrane and ear canal normal.  No middle ear effusion.     Nose: Mucosal edema and congestion present.     Right Sinus: No maxillary sinus tenderness or frontal sinus tenderness.     Left Sinus: No maxillary sinus  tenderness or frontal sinus tenderness.     Mouth/Throat:     Pharynx: Uvula midline. Posterior oropharyngeal erythema present. No oropharyngeal exudate or uvula swelling.  Eyes:     General:        Right eye: No discharge.        Left eye: No discharge.     Pupils: Pupils are equal, round, and reactive to light.  Neck:     Musculoskeletal: Neck supple. No muscular tenderness.  Cardiovascular:     Rate and Rhythm: Normal rate and regular rhythm.     Heart sounds: Normal heart sounds. No murmur.    Pulmonary:     Effort: Pulmonary effort is normal.     Breath sounds: Normal breath sounds. No wheezing.  Lymphadenopathy:     Cervical: No cervical adenopathy.  Skin:    General: Skin is warm and dry.     Findings: No rash.  Neurological:     Mental Status: He is alert and oriented to person, place, and time.  Psychiatric:        Behavior: Behavior normal.      ED Treatments / Results  Labs (all labs ordered are listed, but only abnormal results are displayed) Labs Reviewed - No data to display  EKG None  Radiology No results found.  Procedures Procedures (including critical care time)  Medications Ordered in ED Medications - No data to display   Initial Impression / Assessment and Plan / ED Course  I have reviewed the triage vital signs and the nursing notes.  Pertinent labs & imaging results that were available during my care of the patient were reviewed by me and considered in my medical decision making (see chart for details).  Clinical Course as of Oct 21 1431  Wed Oct 20, 2018  6015 37 year old male with history of URI symptoms x3 days, concerned he may have the flu and needing a note for work.  On exam patient is well-appearing, nontoxic, no distress, lung sounds are clear.  He has boggy nasal membranes with postnasal drip.  Recommend symptomatic treatment, given prescription for Zyrtec-D, Flonase, Tessalon.  Recommend he recheck with his PCP, return to ER for new or worsening symptoms.   [LM]    Clinical Course User Index [LM] Jeannie Fend, PA-C   Final Clinical Impressions(s) / ED Diagnoses   Final diagnoses:  Viral URI with cough    ED Discharge Orders         Ordered    benzonatate (TESSALON) 200 MG capsule  Every 8 hours     10/20/18 1426    cetirizine-pseudoephedrine (ZYRTEC-D) 5-120 MG tablet  Daily     10/20/18 1426    fluticasone (FLONASE) 50 MCG/ACT nasal spray  Daily     10/20/18 1426           Jeannie Fend, PA-C 10/20/18  1432    Little, Ambrose Finland, MD 10/20/18 1556

## 2018-10-20 NOTE — Discharge Instructions (Addendum)
Saline sinus rinse twice daily. Zyrtec D, Flonase as prescribed (also available over the counter). Tessalon as needed as prescribed for cough.  Recheck with your doctor.

## 2019-06-20 ENCOUNTER — Other Ambulatory Visit: Payer: Self-pay

## 2019-06-20 ENCOUNTER — Emergency Department (HOSPITAL_BASED_OUTPATIENT_CLINIC_OR_DEPARTMENT_OTHER): Payer: BC Managed Care – PPO

## 2019-06-20 ENCOUNTER — Encounter (HOSPITAL_BASED_OUTPATIENT_CLINIC_OR_DEPARTMENT_OTHER): Payer: Self-pay

## 2019-06-20 ENCOUNTER — Emergency Department (HOSPITAL_BASED_OUTPATIENT_CLINIC_OR_DEPARTMENT_OTHER)
Admission: EM | Admit: 2019-06-20 | Discharge: 2019-06-20 | Disposition: A | Discharge status: Home / Self Care | Payer: BC Managed Care – PPO | Attending: Emergency Medicine | Admitting: Emergency Medicine

## 2019-06-20 DIAGNOSIS — R1031 Right lower quadrant pain: Secondary | ICD-10-CM | POA: Diagnosis not present

## 2019-06-20 DIAGNOSIS — M62838 Other muscle spasm: Secondary | ICD-10-CM | POA: Insufficient documentation

## 2019-06-20 DIAGNOSIS — Z79899 Other long term (current) drug therapy: Secondary | ICD-10-CM | POA: Diagnosis not present

## 2019-06-20 DIAGNOSIS — R109 Unspecified abdominal pain: Secondary | ICD-10-CM

## 2019-06-20 LAB — URINALYSIS, ROUTINE W REFLEX MICROSCOPIC
Bilirubin Urine: NEGATIVE
Glucose, UA: NEGATIVE mg/dL
Hgb urine dipstick: NEGATIVE
Ketones, ur: NEGATIVE mg/dL
Leukocytes,Ua: NEGATIVE
Nitrite: NEGATIVE
Protein, ur: NEGATIVE mg/dL
Specific Gravity, Urine: 1.02 (ref 1.005–1.030)
pH: 6.5 (ref 5.0–8.0)

## 2019-06-20 MED ORDER — MORPHINE SULFATE (PF) 4 MG/ML IV SOLN: 4.0000 mg | Freq: Once | INTRAVENOUS | Status: DC

## 2019-06-20 MED ORDER — NAPROXEN 500 MG PO TABS: 500.0000 mg | ORAL_TABLET | Freq: Two times a day (BID) | ORAL | 0 refills | Status: DC | PRN

## 2019-06-20 MED ORDER — CYCLOBENZAPRINE HCL 10 MG PO TABS: 10.0000 mg | ORAL_TABLET | Freq: Two times a day (BID) | ORAL | 0 refills | Status: DC | PRN

## 2019-06-20 MED ORDER — ACETAMINOPHEN 325 MG PO TABS
650.0000 mg | ORAL_TABLET | Freq: Once | ORAL | Status: AC
  Administered 2019-06-20: 14:00:00 650 mg via ORAL
  Filled 2019-06-20: qty 2

## 2019-06-20 NOTE — ED Notes (Signed)
Patient transported to CT 

## 2019-06-20 NOTE — ED Provider Notes (Signed)
[facility] EMERGENCY DEPARTMENT Provider Note   CSN: [id number] Arrival date & time: 06/20/19  1315     History   Chief Complaint Chief Complaint  Patient presents with  . Flank Pain    HPI Louis Vargas is a 38 y.o. male.  Presents patient with chief complaint flank pain.  States new onset pain, is never had pain like this before.  Onset since this morning.  States pain is increased with certain positions.  Worse with heavy lifting.  States extends from the right lower back across his right flank.  Currently 7 out of 10 in severity, sharp, cramping pain.  No associated fever, vomiting, dysuria, hematuria.  Denies medical problems, denies past surgical history, denies past history of kidney stones.  Had reaction to iodinated contrast.     HPI  History reviewed. No pertinent past medical history.  There are no active problems to display for this patient.   History reviewed. No pertinent surgical history.      Home Medications    Prior to Admission medications   Medication Sig Start Date End Date Taking? Authorizing Provider  cetirizine-pseudoephedrine (ZYRTEC-D) 5-120 MG tablet Take 1 tablet by mouth daily. 10/20/18   Jeannie FendMurphy, Laura A, PA-C  cyclobenzaprine (FLEXERIL) 10 MG tablet Take 1 tablet (10 mg total) by mouth 2 (two) times daily as needed for muscle spasms. 06/20/19   Milagros Lollykstra, Amiree No S, MD  fluticasone (FLONASE) 50 MCG/ACT nasal spray Place 2 sprays into both nostrils daily. 10/20/18   Jeannie FendMurphy, Laura A, PA-C  naproxen (NAPROSYN) 500 MG tablet Take 1 tablet (500 mg total) by mouth 2 (two) times daily as needed. 06/20/19   Milagros Lollykstra, Sabine Tenenbaum S, MD    Family History No family history on file.  Social History Social History   Tobacco Use  . Smoking status: Never Smoker  . Smokeless tobacco: Never Used  Substance Use Topics  . Alcohol use: Yes    Comment: occ  . Drug use: No     Allergies   Ivp dye [iodinated diagnostic agents]   Review of  Systems Review of Systems  Constitutional: Negative for chills and fever.  HENT: Negative for ear pain and sore throat.   Eyes: Negative for pain and visual disturbance.  Respiratory: Negative for cough and shortness of breath.   Cardiovascular: Negative for chest pain and palpitations.  Gastrointestinal: Negative for abdominal pain and vomiting.  Genitourinary: Negative for dysuria and hematuria.  Musculoskeletal: Negative for arthralgias and back pain.  Skin: Negative for color change and rash.  Neurological: Negative for seizures and syncope.  All other systems reviewed and are negative.    Physical Exam Updated Vital Signs BP (!) 141/90 (BP Location: Left Arm)   Pulse 72   Temp 99.2 F (37.3 C) (Oral)   Resp 20   Ht 6' (1.829 m)   Wt 117.9 kg   BMI 35.26 kg/m   Physical Exam Vitals signs and nursing note reviewed.  Constitutional:      Appearance: He is well-developed.  HENT:     Head: Normocephalic and atraumatic.  Eyes:     Conjunctiva/sclera: Conjunctivae normal.  Neck:     Musculoskeletal: Neck supple.  Cardiovascular:     Rate and Rhythm: Normal rate and regular rhythm.     Heart sounds: No murmur.  Pulmonary:     Effort: Pulmonary effort is normal. No respiratory distress.     Breath sounds: Normal breath sounds.  Abdominal:     Palpations: Abdomen is  soft.     Tenderness: There is no abdominal tenderness.  Musculoskeletal:     Comments: No tenderness palpation midline T or L-spine, there is some tenderness palpation over right flank, right lower back and right lower abdomen  Skin:    General: Skin is warm and dry.  Neurological:     Mental Status: He is alert.      ED Treatments / Results  Labs (all labs ordered are listed, but only abnormal results are displayed) Labs Reviewed  URINALYSIS, ROUTINE W REFLEX MICROSCOPIC    EKG None  Radiology Ct Renal Stone Study  Result Date: 06/20/2019 CLINICAL DATA:  38 year old male with acute RIGHT  flank and abdominal pain today. EXAM: CT ABDOMEN AND PELVIS WITHOUT CONTRAST TECHNIQUE: Multidetector CT imaging of the abdomen and pelvis was performed following the standard protocol without IV contrast. COMPARISON:  None. FINDINGS: Please note that parenchymal abnormalities may be missed without intravenous contrast. Lower chest: No acute abnormality Hepatobiliary: Mild hepatic steatosis noted without suspicious focal hepatic abnormality. The gallbladder is unremarkable. No biliary dilatation. Pancreas: Unremarkable Spleen: Unremarkable Adrenals/Urinary Tract: The kidneys and adrenal glands are unremarkable. Circumferential bladder wall thickening is identified. Stomach/Bowel: Stomach is within normal limits. Appendix appears normal. No evidence of bowel wall thickening, distention, or inflammatory changes. Vascular/Lymphatic: No significant vascular findings are present. No enlarged abdominal or pelvic lymph nodes. Reproductive: Mild prostate enlargement noted. Other: No ascites, focal collection or pneumoperitoneum. Musculoskeletal: No acute or suspicious bony abnormality. IMPRESSION: 1. Mild circumferential bladder wall thickening, which may be related to mildly enlarged prostate. Correlate with possible cystitis. 2. No other acute abnormality identified. No urinary calculi. Normal appendix. 3. Mild hepatic steatosis. Electronically Signed   By: Margarette Canada M.D.   On: 06/20/2019 14:42    Procedures Procedures (including critical care time)  Medications Ordered in ED Medications  morphine 4 MG/ML injection 4 mg (4 mg Intravenous Not Given 06/20/19 1412)  acetaminophen (TYLENOL) tablet 650 mg (650 mg Oral Given 06/20/19 1415)     Initial Impression / Assessment and Plan / ED Course  I have reviewed the triage vital signs and the nursing notes.  Pertinent labs & imaging results that were available during my care of the patient were reviewed by me and considered in my medical decision making (see  chart for details).       38 year old gentleman presents emergency department with new onset right flank pain.  Urine negative for infection.  CT without evidence for stone.  Based on location of pain, suspect patient has muscle strain, muscle spasms.  Frequent lifting heavy weights at work at [employer].  Recommended trial of muscle relaxer, NSAIDs, rest and warm cold compresses.  Recommend recheck with primary doctor, reviewed precautions, will discharge home.    After the discussed management above, the patient was determined to be safe for discharge.  The patient was in agreement with this plan and all questions regarding their care were answered.  ED return precautions were discussed and the patient will return to the ED with any significant worsening of condition.   Final Clinical Impressions(s) / ED Diagnoses   Final diagnoses:  Flank pain  Muscle spasm    ED Discharge Orders         Ordered    naproxen (NAPROSYN) 500 MG tablet  2 times daily PRN     06/20/19 1518    cyclobenzaprine (FLEXERIL) 10 MG tablet  2 times daily PRN     06/20/19 1518  Milagros Lollykstra, Devante Capano S, MD 06/20/19 [phone]

## 2019-06-20 NOTE — ED Notes (Signed)
C/o rt flank onset this am,  Pain increased w bending

## 2019-06-20 NOTE — ED Triage Notes (Addendum)
Pt c/o right flank pain x today-denies injury-NAD-steady gait

## 2019-06-20 NOTE — Discharge Instructions (Signed)
Recommend taking prescribed naproxen and Flexeril as needed for symptom control.  Please note the Flexeril has potential to make you somewhat drowsy and should not be taking while operating heavy machinery or driving.  Additionally recommend warming cold alternating compresses.  Recommend resting from heavy lifting for the next 24 to 48 hours.  If you develop worsening pain, bladder bowel incontinence, numbness, weakness, abdominal pain or other new concerning symptoms recommend returning to the ER for reassessment.

## 2019-11-24 ENCOUNTER — Emergency Department (HOSPITAL_BASED_OUTPATIENT_CLINIC_OR_DEPARTMENT_OTHER)
Admission: EM | Admit: 2019-11-24 | Discharge: 2019-11-24 | Disposition: A | Discharge status: Home / Self Care | Payer: BC Managed Care – PPO | Attending: Emergency Medicine | Admitting: Emergency Medicine

## 2019-11-24 ENCOUNTER — Emergency Department (HOSPITAL_BASED_OUTPATIENT_CLINIC_OR_DEPARTMENT_OTHER): Payer: BC Managed Care – PPO

## 2019-11-24 ENCOUNTER — Other Ambulatory Visit: Payer: Self-pay

## 2019-11-24 ENCOUNTER — Encounter (HOSPITAL_BASED_OUTPATIENT_CLINIC_OR_DEPARTMENT_OTHER): Payer: Self-pay | Admitting: Emergency Medicine

## 2019-11-24 DIAGNOSIS — R509 Fever, unspecified: Secondary | ICD-10-CM | POA: Insufficient documentation

## 2019-11-24 DIAGNOSIS — U071 COVID-19: Secondary | ICD-10-CM | POA: Insufficient documentation

## 2019-11-24 LAB — CBC WITH DIFFERENTIAL/PLATELET
Abs Immature Granulocytes: 0.02 10*3/uL (ref 0.00–0.07)
Basophils Absolute: 0 10*3/uL (ref 0.0–0.1)
Basophils Relative: 1 %
Eosinophils Absolute: 0 10*3/uL (ref 0.0–0.5)
Eosinophils Relative: 0 %
HCT: 39.7 % (ref 39.0–52.0)
Hemoglobin: 11.7 g/dL — ABNORMAL LOW (ref 13.0–17.0)
Immature Granulocytes: 1 %
Lymphocytes Relative: 23 %
Lymphs Abs: 1 10*3/uL (ref 0.7–4.0)
MCH: 23.3 pg — ABNORMAL LOW (ref 26.0–34.0)
MCHC: 29.5 g/dL — ABNORMAL LOW (ref 30.0–36.0)
MCV: 78.9 fL — ABNORMAL LOW (ref 80.0–100.0)
Monocytes Absolute: 1.1 10*3/uL — ABNORMAL HIGH (ref 0.1–1.0)
Monocytes Relative: 24 %
Neutro Abs: 2.3 10*3/uL (ref 1.7–7.7)
Neutrophils Relative %: 51 %
Platelets: 249 10*3/uL (ref 150–400)
RBC: 5.03 MIL/uL (ref 4.22–5.81)
RDW: 13.5 % (ref 11.5–15.5)
WBC: 4.4 10*3/uL (ref 4.0–10.5)
nRBC: 0 % (ref 0.0–0.2)

## 2019-11-24 LAB — BASIC METABOLIC PANEL
Anion gap: 8 (ref 5–15)
BUN: 11 mg/dL (ref 6–20)
CO2: 25 mmol/L (ref 22–32)
Calcium: 8.5 mg/dL — ABNORMAL LOW (ref 8.9–10.3)
Chloride: 107 mmol/L (ref 98–111)
Creatinine, Ser: 1.13 mg/dL (ref 0.61–1.24)
GFR calc Af Amer: >60 mL/min (ref >60)
GFR calc non Af Amer: >60 mL/min (ref >60)
Glucose, Bld: 111 mg/dL — ABNORMAL HIGH (ref 70–99)
Potassium: 3.6 mmol/L (ref 3.5–5.1)
Sodium: 140 mmol/L (ref 135–145)

## 2019-11-24 LAB — SARS CORONAVIRUS 2 AG (30 MIN TAT): SARS Coronavirus 2 Ag: POSITIVE — AB

## 2019-11-24 MED ORDER — BENZONATATE 100 MG PO CAPS: 100.0000 mg | ORAL_CAPSULE | Freq: Three times a day (TID) | ORAL | 0 refills | Status: DC

## 2019-11-24 MED ORDER — IBUPROFEN 200 MG PO TABS
600.0000 mg | ORAL_TABLET | Freq: Once | ORAL | Status: AC
  Administered 2019-11-24: 10:00:00 600 mg via ORAL
  Filled 2019-11-24: qty 1

## 2019-11-24 MED ORDER — ACETAMINOPHEN 325 MG PO TABS
650.0000 mg | ORAL_TABLET | Freq: Once | ORAL | Status: AC | PRN
  Administered 2019-11-24: 650 mg via ORAL
  Filled 2019-11-24: qty 2

## 2019-11-24 NOTE — Discharge Instructions (Signed)
Please review the discharge instructions regarding information about COVID-19.  Take Tylenol or ibuprofen as needed for headache and body aches.  The Tessalon medication can help you with coughing.  Make sure to isolate yourself.  He will need to remain home for 10 days since your first positive test and improvement of your symptoms.

## 2019-11-24 NOTE — ED Notes (Signed)
[id number]  Louis Vargas  Cell number

## 2019-11-24 NOTE — ED Notes (Signed)
Pt on monitor 

## 2019-11-24 NOTE — ED Triage Notes (Signed)
Cough and h/a  X 2-3 days has gotten worse ,  No n/v some diarrhea  tho

## 2019-11-24 NOTE — ED Provider Notes (Signed)
[facility] EMERGENCY DEPARTMENT Provider Note   CSN: [id number] Arrival date & time: 11/24/19  2993     History Chief Complaint  Patient presents with  . Migraine  . Cough  . Fever    Louis Vargas is a 39 y.o. male.  HPI   Patient presents to the emergency room for evaluation of cough, headache, and fevers.  Patient states he noticed the symptoms started a few days ago.  He was having some cough and congestion.  He also started to notice a change in his taste.  Patient also is now having diffuse body aches.  His back is sore.  Patient denies any dysuria but does complain of some urinary frequency although he states that is normal.  He denies any nausea or vomiting.  Some loose stools.  He went to work today and initially was told his temperature was normal.  His symptoms persisted.  History reviewed. No pertinent past medical history.  There are no problems to display for this patient.   No past surgical history on file.     No family history on file.  Social History   Tobacco Use  . Smoking status: Never Smoker  . Smokeless tobacco: Never Used  Substance Use Topics  . Alcohol use: Yes    Comment: occ  . Drug use: No    Home Medications Prior to Admission medications   Medication Sig Start Date End Date Taking? Authorizing Provider  cetirizine-pseudoephedrine (ZYRTEC-D) 5-120 MG tablet Take 1 tablet by mouth daily. 10/20/18   Jeannie Fend, PA-C  cyclobenzaprine (FLEXERIL) 10 MG tablet Take 1 tablet (10 mg total) by mouth 2 (two) times daily as needed for muscle spasms. 06/20/19   Milagros Loll, MD  fluticasone (FLONASE) 50 MCG/ACT nasal spray Place 2 sprays into both nostrils daily. 10/20/18   Jeannie Fend, PA-C  naproxen (NAPROSYN) 500 MG tablet Take 1 tablet (500 mg total) by mouth 2 (two) times daily as needed. 06/20/19   Milagros Loll, MD    Allergies    Ivp dye [iodinated diagnostic agents]  Review of Systems   Review of Systems    All other systems reviewed and are negative.   Physical Exam Updated Vital Signs BP 125/85   Pulse 75   Temp 99.4 F (37.4 C) (Oral)   Resp 14   Ht 1.88 m (6\' 2" )   Wt 99.8 kg   SpO2 95%   BMI 28.25 kg/m   Physical Exam Vitals and nursing note reviewed.  Constitutional:      General: He is not in acute distress.    Appearance: He is well-developed.  HENT:     Head: Normocephalic and atraumatic.     Right Ear: External ear normal.     Left Ear: External ear normal.  Eyes:     General: No scleral icterus.       Right eye: No discharge.        Left eye: No discharge.     Conjunctiva/sclera: Conjunctivae normal.  Neck:     Trachea: No tracheal deviation.  Cardiovascular:     Rate and Rhythm: Normal rate and regular rhythm.  Pulmonary:     Effort: Pulmonary effort is normal. No respiratory distress.     Breath sounds: Normal breath sounds. No stridor. No wheezing or rales.  Abdominal:     General: Bowel sounds are normal. There is no distension.     Palpations: Abdomen is soft.  Tenderness: There is no abdominal tenderness. There is no guarding or rebound.  Musculoskeletal:        General: No tenderness.     Cervical back: Neck supple.  Skin:    General: Skin is warm and dry.     Findings: No rash.  Neurological:     Mental Status: He is alert.     Cranial Nerves: No cranial nerve deficit (no facial droop, extraocular movements intact, no slurred speech).     Sensory: No sensory deficit.     Motor: No abnormal muscle tone or seizure activity.     Coordination: Coordination normal.     ED Results / Procedures / Treatments   Labs (all labs ordered are listed, but only abnormal results are displayed) Labs Reviewed  SARS CORONAVIRUS 2 AG (30 MIN TAT) - Abnormal; Notable for the following components:      Result Value   SARS Coronavirus 2 Ag POSITIVE (*)    All other components within normal limits  CBC WITH DIFFERENTIAL/PLATELET - Abnormal; Notable for the  following components:   Hemoglobin 11.7 (*)    MCV 78.9 (*)    MCH 23.3 (*)    MCHC 29.5 (*)    Monocytes Absolute 1.1 (*)    All other components within normal limits  BASIC METABOLIC PANEL - Abnormal; Notable for the following components:   Glucose, Bld 111 (*)    Calcium 8.5 (*)    All other components within normal limits    EKG EKG Interpretation  Date/Time:  Thursday November 24 2019 09:24:15 EST Ventricular Rate:  93 PR Interval:    QRS Duration: 100 QT Interval:  345 QTC Calculation: 430 R Axis:   110 Text Interpretation: Sinus rhythm Consider left atrial enlargement Right axis deviation Minimal ST depression, inferior leads , new since last tracing (april 2016) Borderline ST elevation, lateral leads Baseline wander in lead(s) V3 Confirmed by Dorie Rank [phone]) on 11/24/2019 9:39:38 AM   Radiology DG Chest Portable 1 View  Result Date: 11/24/2019 CLINICAL DATA:  Worsening cough and headache for the past 2-3 days. EXAM: PORTABLE CHEST 1 VIEW COMPARISON:  PA and lateral chest 11/11/2016. FINDINGS: The lungs are clear. Heart size is normal. No pneumothorax or pleural fluid. No acute focal bony abnormality. IMPRESSION: Negative chest. Electronically Signed   By: Inge Rise M.D.   On: 11/24/2019 10:27    Procedures Procedures (including critical care time)  Medications Ordered in ED Medications  acetaminophen (TYLENOL) tablet 650 mg (650 mg Oral Given 11/24/19 0934)  ibuprofen (ADVIL) tablet 600 mg (600 mg Oral Given 11/24/19 1009)    ED Course  I have reviewed the triage vital signs and the nursing notes.  Pertinent labs & imaging results that were available during my care of the patient were reviewed by me and considered in my medical decision making (see chart for details).  Clinical Course as of Nov 23 1312  Thu Nov 24, 2019  1002 Symptoms concerning for COVID-19.  Oxygenation is normal.  When he ambulated it remained normal.  We will proceed with eval.    [JK]  1116 Laboratory tests are notable for positive Covid test.  CBC and electrolyte panel otherwise unremarkable.   [JG]  2836 Chest x-ray without pneumonia.   [JK]    Clinical Course User Index [JK] Dorie Rank, MD   MDM Rules/Calculators/A&P                      Lilia Pro  Barris was evaluated in Emergency Department on 11/24/2019 for the symptoms described in the history of present illness. He was evaluated in the context of the global COVID-19 pandemic, which necessitated consideration that the patient might be at risk for infection with the SARS-CoV-2 virus that causes COVID-19. Institutional protocols and algorithms that pertain to the evaluation of patients at risk for COVID-19 are in a state of rapid change based on information released by regulatory bodies including the CDC and federal and state organizations. These policies and algorithms were followed during the patient's care in the ED.  Patient symptoms are consistent with Covid virus infection.  He has fever cough and headache.  Covid test is positive.  No evidence of pneumonia.  Patient does not have an oxygen requirement. Final Clinical Impression(s) / ED Diagnoses Final diagnoses:  COVID-19 virus infection    Rx / DC Orders ED Discharge Orders    None       Linwood Dibbles, MD 11/24/19 1314

## 2020-03-04 ENCOUNTER — Emergency Department (HOSPITAL_BASED_OUTPATIENT_CLINIC_OR_DEPARTMENT_OTHER): Payer: Self-pay

## 2020-03-04 ENCOUNTER — Inpatient Hospital Stay (HOSPITAL_BASED_OUTPATIENT_CLINIC_OR_DEPARTMENT_OTHER)
Admission: EM | Admit: 2020-03-04 | Discharge: 2020-03-07 | DRG: 638 | Disposition: A | Discharge status: Home / Self Care | Payer: Self-pay | Attending: Internal Medicine | Admitting: Internal Medicine

## 2020-03-04 ENCOUNTER — Other Ambulatory Visit: Payer: Self-pay

## 2020-03-04 ENCOUNTER — Encounter (HOSPITAL_BASED_OUTPATIENT_CLINIC_OR_DEPARTMENT_OTHER): Payer: Self-pay | Admitting: Emergency Medicine

## 2020-03-04 DIAGNOSIS — U071 COVID-19: Secondary | ICD-10-CM

## 2020-03-04 DIAGNOSIS — E860 Dehydration: Secondary | ICD-10-CM | POA: Diagnosis present

## 2020-03-04 DIAGNOSIS — N179 Acute kidney failure, unspecified: Secondary | ICD-10-CM | POA: Diagnosis present

## 2020-03-04 DIAGNOSIS — E1010 Type 1 diabetes mellitus with ketoacidosis without coma: Secondary | ICD-10-CM

## 2020-03-04 DIAGNOSIS — E669 Obesity, unspecified: Secondary | ICD-10-CM | POA: Diagnosis present

## 2020-03-04 DIAGNOSIS — Z6832 Body mass index (BMI) 32.0-32.9, adult: Secondary | ICD-10-CM

## 2020-03-04 DIAGNOSIS — Z791 Long term (current) use of non-steroidal anti-inflammatories (NSAID): Secondary | ICD-10-CM

## 2020-03-04 DIAGNOSIS — E781 Pure hyperglyceridemia: Secondary | ICD-10-CM | POA: Diagnosis present

## 2020-03-04 DIAGNOSIS — Z91041 Radiographic dye allergy status: Secondary | ICD-10-CM

## 2020-03-04 DIAGNOSIS — Z833 Family history of diabetes mellitus: Secondary | ICD-10-CM

## 2020-03-04 DIAGNOSIS — E1110 Type 2 diabetes mellitus with ketoacidosis without coma: Principal | ICD-10-CM | POA: Diagnosis present

## 2020-03-04 DIAGNOSIS — E119 Type 2 diabetes mellitus without complications: Secondary | ICD-10-CM

## 2020-03-04 DIAGNOSIS — Z8616 Personal history of COVID-19: Secondary | ICD-10-CM

## 2020-03-04 DIAGNOSIS — Z79899 Other long term (current) drug therapy: Secondary | ICD-10-CM

## 2020-03-04 DIAGNOSIS — E875 Hyperkalemia: Secondary | ICD-10-CM | POA: Diagnosis present

## 2020-03-04 HISTORY — DX: Obesity, class 1: E66.811

## 2020-03-04 HISTORY — DX: Obesity, unspecified: E66.9

## 2020-03-04 LAB — URINALYSIS, ROUTINE W REFLEX MICROSCOPIC
Bilirubin Urine: NEGATIVE
Glucose, UA: >=500 mg/dL — AB
Ketones, ur: 40 mg/dL — AB
Leukocytes,Ua: NEGATIVE
Nitrite: NEGATIVE
Protein, ur: NEGATIVE mg/dL
Specific Gravity, Urine: 1.01 (ref 1.005–1.030)
pH: 5.5 (ref 5.0–8.0)

## 2020-03-04 LAB — CBC WITH DIFFERENTIAL/PLATELET
Abs Immature Granulocytes: 0.02 10*3/uL (ref 0.00–0.07)
Basophils Absolute: 0 10*3/uL (ref 0.0–0.1)
Basophils Relative: 1 %
Eosinophils Absolute: 0 10*3/uL (ref 0.0–0.5)
Eosinophils Relative: 0 %
HCT: 47.1 % (ref 39.0–52.0)
Hemoglobin: 15.5 g/dL (ref 13.0–17.0)
Immature Granulocytes: 0 %
Lymphocytes Relative: 31 %
Lymphs Abs: 1.7 10*3/uL (ref 0.7–4.0)
MCH: 24.3 pg — ABNORMAL LOW (ref 26.0–34.0)
MCHC: 32.9 g/dL (ref 30.0–36.0)
MCV: 73.9 fL — ABNORMAL LOW (ref 80.0–100.0)
Monocytes Absolute: 0.6 10*3/uL (ref 0.1–1.0)
Monocytes Relative: 11 %
Neutro Abs: 3 10*3/uL (ref 1.7–7.7)
Neutrophils Relative %: 57 %
Platelets: 312 10*3/uL (ref 150–400)
RBC: 6.37 MIL/uL — ABNORMAL HIGH (ref 4.22–5.81)
RDW: 13.4 % (ref 11.5–15.5)
WBC: 5.3 10*3/uL (ref 4.0–10.5)
nRBC: 0 % (ref 0.0–0.2)

## 2020-03-04 LAB — BASIC METABOLIC PANEL
Anion gap: 13 (ref 5–15)
BUN: 14 mg/dL (ref 6–20)
CO2: 20 mmol/L — ABNORMAL LOW (ref 22–32)
Calcium: 9.2 mg/dL (ref 8.9–10.3)
Chloride: 100 mmol/L (ref 98–111)
Creatinine, Ser: 1.05 mg/dL (ref 0.61–1.24)
GFR calc Af Amer: >60 mL/min (ref >60)
GFR calc non Af Amer: >60 mL/min (ref >60)
Glucose, Bld: 246 mg/dL — ABNORMAL HIGH (ref 70–99)
Potassium: 4.1 mmol/L (ref 3.5–5.1)
Sodium: 133 mmol/L — ABNORMAL LOW (ref 135–145)

## 2020-03-04 LAB — COMPREHENSIVE METABOLIC PANEL
ALT: 65 U/L — ABNORMAL HIGH (ref 0–44)
AST: 42 U/L — ABNORMAL HIGH (ref 15–41)
Albumin: 4.4 g/dL (ref 3.5–5.0)
Alkaline Phosphatase: 102 U/L (ref 38–126)
Anion gap: 20 — ABNORMAL HIGH (ref 5–15)
BUN: 20 mg/dL (ref 6–20)
CO2: 19 mmol/L — ABNORMAL LOW (ref 22–32)
Calcium: 9.7 mg/dL (ref 8.9–10.3)
Chloride: 92 mmol/L — ABNORMAL LOW (ref 98–111)
Creatinine, Ser: 1.52 mg/dL — ABNORMAL HIGH (ref 0.61–1.24)
GFR calc Af Amer: >60 mL/min (ref >60)
GFR calc non Af Amer: 57 mL/min — ABNORMAL LOW (ref >60)
Glucose, Bld: 929 mg/dL (ref 70–99)
Potassium: 6.4 mmol/L (ref 3.5–5.1)
Sodium: 131 mmol/L — ABNORMAL LOW (ref 135–145)
Total Bilirubin: 2 mg/dL — ABNORMAL HIGH (ref 0.3–1.2)
Total Protein: 7.5 g/dL (ref 6.5–8.1)

## 2020-03-04 LAB — LIPID PANEL
Cholesterol: 471 mg/dL — ABNORMAL HIGH (ref 0–200)
Triglycerides: >5000 mg/dL — ABNORMAL HIGH (ref <150)
VLDL: UNDETERMINED mg/dL (ref 0–40)

## 2020-03-04 LAB — URINALYSIS, MICROSCOPIC (REFLEX)

## 2020-03-04 LAB — GLUCOSE, CAPILLARY
Glucose-Capillary: 252 mg/dL — ABNORMAL HIGH (ref 70–99)
Glucose-Capillary: 257 mg/dL — ABNORMAL HIGH (ref 70–99)
Glucose-Capillary: 279 mg/dL — ABNORMAL HIGH (ref 70–99)

## 2020-03-04 LAB — CBG MONITORING, ED
Glucose-Capillary: >600 mg/dL (ref 70–99)
Glucose-Capillary: >600 mg/dL (ref 70–99)
Glucose-Capillary: >600 mg/dL (ref 70–99)
Glucose-Capillary: 389 mg/dL — ABNORMAL HIGH (ref 70–99)
Glucose-Capillary: 512 mg/dL (ref 70–99)
Glucose-Capillary: 331 mg/dL — ABNORMAL HIGH (ref 70–99)
Glucose-Capillary: 420 mg/dL — ABNORMAL HIGH (ref 70–99)

## 2020-03-04 LAB — RESPIRATORY PANEL BY RT PCR (FLU A&B, COVID)
Influenza A by PCR: NEGATIVE
Influenza B by PCR: NEGATIVE
SARS Coronavirus 2 by RT PCR: POSITIVE — AB

## 2020-03-04 LAB — C-REACTIVE PROTEIN: CRP: <0.5 mg/dL (ref <1.0)

## 2020-03-04 LAB — FERRITIN: Ferritin: 802 ng/mL — ABNORMAL HIGH (ref 24–336)

## 2020-03-04 LAB — D-DIMER, QUANTITATIVE: D-Dimer, Quant: 0.52 ug/mL-FEU — ABNORMAL HIGH (ref 0.00–0.50)

## 2020-03-04 LAB — LIPASE, BLOOD: Lipase: 29 U/L (ref 11–51)

## 2020-03-04 LAB — BETA-HYDROXYBUTYRIC ACID: Beta-Hydroxybutyric Acid: 3.24 mmol/L — ABNORMAL HIGH (ref 0.05–0.27)

## 2020-03-04 MED ORDER — DEXTROSE-NACL 5-0.45 % IV SOLN: INTRAVENOUS | Status: DC

## 2020-03-04 MED ORDER — HEPARIN SODIUM (PORCINE) 5000 UNIT/ML IJ SOLN
5000.0000 [IU] | Freq: Three times a day (TID) | INTRAMUSCULAR | Status: DC
  Administered 2020-03-04 – 2020-03-07 (×8): 5000 [IU] via SUBCUTANEOUS
  Filled 2020-03-04 (×9): qty 1

## 2020-03-04 MED ORDER — SODIUM CHLORIDE 0.9 % IV SOLN: INTRAVENOUS | Status: DC

## 2020-03-04 MED ORDER — ORAL CARE MOUTH RINSE
15.0000 mL | Freq: Two times a day (BID) | OROMUCOSAL | Status: DC
  Administered 2020-03-04 – 2020-03-07 (×6): 15 mL via OROMUCOSAL

## 2020-03-04 MED ORDER — DEXTROSE 50 % IV SOLN: 0.0000 mL | INTRAVENOUS | Status: DC | PRN

## 2020-03-04 MED ORDER — INSULIN REGULAR(HUMAN) IN NACL 100-0.9 UT/100ML-% IV SOLN
INTRAVENOUS | Status: DC
  Administered 2020-03-04: 7.5 [IU]/h via INTRAVENOUS
  Administered 2020-03-04: 15 [IU]/h via INTRAVENOUS
  Filled 2020-03-04 (×2): qty 100

## 2020-03-04 MED ORDER — CHLORHEXIDINE GLUCONATE CLOTH 2 % EX PADS
6.0000 | MEDICATED_PAD | Freq: Every day | CUTANEOUS | Status: DC
  Administered 2020-03-04 – 2020-03-06 (×3): 6 via TOPICAL

## 2020-03-04 MED ORDER — SODIUM CHLORIDE 0.9 % IV BOLUS
1000.0000 mL | Freq: Once | INTRAVENOUS | Status: AC
  Administered 2020-03-04: 18:00:00 1000 mL via INTRAVENOUS

## 2020-03-04 MED ORDER — SODIUM CHLORIDE 0.9 % IV BOLUS
1000.0000 mL | Freq: Once | INTRAVENOUS | Status: AC
  Administered 2020-03-04: 1000 mL via INTRAVENOUS

## 2020-03-04 NOTE — H&P (Signed)
History Louis Physical    Wilmer Santillo DXI:[id number] DOB: 11/07/1980 DOA: 03/04/2020  PCP: Patient, No Pcp Per  Patient coming from: [facility] ED  I have personally briefly reviewed patient's old medical records in [place]  Chief Complaint: weakness, Vargas, Louis Vargas  HPI: Louis Vargas is a 39 y.o. male with medical history significant for obesity Louis previous positive COVID-19 infection on 11/24/2019 who presented to [facility] ED earlier today for evaluation of Vargas, Louis Vargas, Louis lethargy.  Patient reports 2-3 days of frequent urination Louis excessive thirst.  He has had nausea Louis dry heaves without emesis.  He reports decreased appetite Louis mild lower abdominal pain.  He has had associated fatigue/lethargy with some lightheadedness/dizziness without fall or syncope.  He denies any subjective fevers, chills, diaphoresis, cough, dyspnea, chest pain, dysuria, or diarrhea.  He denies any known personal chronic medical conditions.  He states that he has been drinking [facility] in excess.  He reports a history of diabetes in his mother, brother, Louis sister.  He denies any history of tobacco use.  He reports occasional alcohol Louis denies any illicit drug use.  He denies any prior surgeries.  He reports an allergy to IV contrast dye which caused swelling in the past.  Of note, patient had previous COVID-19 positive test on 11/24/2019 Louis did have mild symptoms at that time.  He was seen in the ED Louis did not require direct treatment.  He states that he has been symptom-free from this standpoint for some time now.  [facility] ED Course:  Initial vitals showed BP 133/76, pulse 84, RR 17, temp 98.2 Fahrenheit, SPO2 93% on room air.  Labs are notable for initial CBG > 600.  Serum glucose 929, sodium 131, potassium 6.4 (slight hemolysis noted) bicarb 19, anion gap 20, BUN 12, creatinine 1.52 (1.13 previously) AST 42, ALT 65, alk phos 102, total  bilirubin 2.0, lipase 29.  Lipid panel showed >5000 triglycerides, 471 cholesterol.  Urinalysis showed >500 glucose, 40 ketones, negative protein, negative nitrite, negative leukocytes, 0-5 RBC Louis WBC/hpf, few bacteria microscopy.  Portable chest x-ray was negative for focal consolidation, edema, or effusion.  SARS-CoV-2 PCR was positive.  Influenza A/B PCR's were negative.  Patient was given 2 L normal saline Louis started on insulin infusion with IV NS at 75 mL/hr.  The hospitalist service was consulted to transfer/admit for further evaluation Louis management.  Review of Systems: All systems reviewed Louis are negative except as documented in history of present illness above.   Past Medical History:  Diagnosis Date  . Obesity (BMI 30.0-34.9)     History reviewed. No pertinent surgical history.  Social History:  reports that he has never smoked. He has never used smokeless tobacco. He reports current alcohol use. He reports that he does not use drugs.  Allergies  Allergen Reactions  . Ivp Dye [Iodinated Diagnostic Agents]     Family History  Problem Relation Age of Onset  . Diabetes Mother   . Diabetes Sister   . Diabetes Brother      Prior to Admission medications   Medication Sig Start Date End Date Taking? Authorizing Provider  benzonatate (TESSALON) 100 MG capsule Take 1 capsule (100 mg total) by mouth every 8 (eight) hours. 11/24/19   Dorie Rank, MD  cetirizine-pseudoephedrine (ZYRTEC-D) 5-120 MG tablet Take 1 tablet by mouth daily. 10/20/18   Tacy Learn, PA-C  cyclobenzaprine (FLEXERIL) 10 MG tablet Take 1 tablet (  10 mg total) by mouth 2 (two) times daily as needed for muscle spasms. 06/20/19   Lucrezia Starch, MD  fluticasone (FLONASE) 50 MCG/ACT nasal spray Place 2 sprays into both nostrils daily. 10/20/18   Tacy Learn, PA-C  naproxen (NAPROSYN) 500 MG tablet Take 1 tablet (500 mg total) by mouth 2 (two) times daily as needed. 06/20/19   Lucrezia Starch, MD     Physical Exam: Vitals:   03/04/20 2030 03/04/20 2048 03/04/20 2130 03/04/20 2200  BP: 137/85  (!) 147/81 (!) 149/94  Pulse: 88  80 90  Resp:   15 13  Temp:  98.9 F (37.2 C)    TempSrc:  Oral    SpO2: 98%  98% 96%  Weight:   118.6 kg   Height:   6' 1" (1.854 m)    Constitutional: Obese man resting in bed with head elevated, NAD, calm, comfortable Eyes: PERRL, lids Louis conjunctivae normal ENMT: Mucous membranes are dry. Posterior pharynx clear of any exudate or lesions.Normal dentition.  Neck: normal, supple, no masses. Respiratory: clear to auscultation bilaterally, no wheezing, no crackles. Normal respiratory effort. No accessory muscle use.  Cardiovascular: Regular rate Louis rhythm, no murmurs / rubs / gallops. No extremity edema. 2+ pedal pulses. Abdomen: Mild lower abdominal tenderness, no masses palpated. No hepatosplenomegaly. Bowel sounds positive.  Musculoskeletal: no clubbing / cyanosis. No joint deformity upper Louis lower extremities. Good ROM, no contractures. Normal muscle tone.  Skin: no rashes, lesions, ulcers. No induration Neurologic: CN 2-12 grossly intact. Sensation intact, Strength 5/5 in all 4.  Psychiatric: Normal judgment Louis insight. Alert Louis oriented x 3. Normal mood.   Labs on Admission: I have personally reviewed following labs Louis imaging studies  CBC: Recent Labs  Lab 03/04/20 1321  WBC 5.3  NEUTROABS 3.0  HGB 15.5  HCT 47.1  MCV 73.9*  PLT 532   Basic Metabolic Panel: Recent Labs  Lab 03/04/20 1416  NA 131*  K 6.4*  CL 92*  CO2 19*  GLUCOSE 929*  BUN 20  CREATININE 1.52*  CALCIUM 9.7   GFR: Estimated Creatinine Clearance: 88 mL/min (A) (by C-G formula based on SCr of 1.52 mg/dL (H)). Liver Function Tests: Recent Labs  Lab 03/04/20 1416  AST 42*  ALT 65*  ALKPHOS 102  BILITOT 2.0*  PROT 7.5  ALBUMIN 4.4   Recent Labs  Lab 03/04/20 1416  LIPASE 29   No results for input(s): AMMONIA in the last 168 hours. Coagulation  Profile: No results for input(s): INR, PROTIME in the last 168 hours. Cardiac Enzymes: No results for input(s): CKTOTAL, CKMB, CKMBINDEX, TROPONINI in the last 168 hours. BNP (last 3 results) No results for input(s): PROBNP in the last 8760 hours. HbA1C: No results for input(s): HGBA1C in the last 72 hours. CBG: Recent Labs  Lab 03/04/20 1838 03/04/20 1918 03/04/20 1952 03/04/20 2027 03/04/20 2134  GLUCAP 512* 389* 420* 331* 279*   Lipid Profile: Recent Labs    03/04/20 1416  CHOL 471*  HDL NOT REPORTED DUE TO HIGH TRIGLYCERIDES  LDLCALC NOT CALCULATED  TRIG >5,000*  CHOLHDL NOT REPORTED DUE TO HIGH TRIGLYCERIDES   Thyroid Function Tests: No results for input(s): TSH, T4TOTAL, FREET4, T3FREE, THYROIDAB in the last 72 hours. Anemia Panel: No results for input(s): VITAMINB12, FOLATE, FERRITIN, TIBC, IRON, RETICCTPCT in the last 72 hours. Urine analysis:    Component Value Date/Time   COLORURINE STRAW (A) 03/04/2020 1321   APPEARANCEUR CLEAR 03/04/2020 1321   LABSPEC  1.010 03/04/2020 1321   PHURINE 5.5 03/04/2020 1321   GLUCOSEU >=500 (A) 03/04/2020 1321   HGBUR SMALL (A) 03/04/2020 1321   BILIRUBINUR NEGATIVE 03/04/2020 1321   KETONESUR 40 (A) 03/04/2020 1321   PROTEINUR NEGATIVE 03/04/2020 1321   NITRITE NEGATIVE 03/04/2020 1321   LEUKOCYTESUR NEGATIVE 03/04/2020 1321    Radiological Exams on Admission: DG Chest Portable 1 View  Result Date: 03/04/2020 CLINICAL DATA:  Not feeling well with dizziness Louis urinary frequency 1 week. EXAM: PORTABLE CHEST 1 VIEW COMPARISON:  11/24/2019 FINDINGS: Lungs are adequately inflated Louis otherwise clear. Cardiomediastinal silhouette Louis remainder of the exam is unchanged. IMPRESSION: No active disease. Electronically Signed   By: Marin Olp M.D.   On: 03/04/2020 13:55    EKG: Pending.  Assessment/Plan Principal Problem:   DKA (diabetic ketoacidoses) (HCC) Active Problems:   Obesity (BMI 30-39.9)   Acute kidney injury  ([place])   Hyperkalemia   Lab test positive for detection of COVID-19 virus  Louis Vargas is a 39 y.o. male with medical history significant for obesity Louis previous positive COVID-19 infection on 11/24/2019 who is admitted with severe hyperglycemia/DKA with new onset diabetes.  DKA with new onset diabetes: No prior known history of diabetes.  Found to have serum glucose 929 in the ED with bicarb 19, anion gap 20, positive ketones in the urine, Louis acute kidney injury. -Continue insulin infusion per protocol -Continue IV NS 125 mL/hr Louis transition to D5-1/2 NS 75 mL/hr when CBG <250 -Repeat BMP now Louis q4h Louis transition to SQ insulin when gap closed per protocol  Acute kidney injury: Due to dehydration in setting of DKA.  Continue IV fluid hydration Louis follow renal function on labs.  Hyperkalemia: In setting of AKI with slight hemolysis noted.  Expect this will improve with insulin infusion.  Follow on repeat BMP Louis consider treatment if remains elevated.  Hypertriglyceridemia: In setting of diabetic ketoacidosis Louis obesity.  Positive COVID-19 test: Had previous positive COVID-19 test on 11/24/2019.  He did not require directed treatment at that time.  Repeat PCR testing is again positive.  He is asymptomatic at this time but out of the 90-day window from last positive test.  DVT prophylaxis: Lovenox Code Status: Full code, confirmed with patient Family Communication: Discussed with patient, he has discussed with family Disposition Plan: From home, likely discharge to home pending management of DKA Louis transition to SQ insulin Consults called: None Admission status:  Status is: Inpatient  Remains inpatient appropriate because:IV treatments appropriate due to intensity of illness or inability to take PO   Dispo: The patient is from: Home              Anticipated d/c is to: Home              Anticipated d/c date is: 2 days              Patient currently is not medically stable to  d/c.  Zada Finders MD [facility]  If 7PM-7AM, please contact night-coverage [email or web address]  03/04/2020, 10:35 PM

## 2020-03-04 NOTE — ED Notes (Signed)
Insulin drip decreased to 7.5 per endotool.

## 2020-03-04 NOTE — ED Notes (Signed)
Per endotool insulin remains at 15/hr.

## 2020-03-04 NOTE — ED Triage Notes (Addendum)
Pt c/o not feeling well, dizziness, urinary frequency x 1 week. Pt noted to have blood sugar reading "HI" in triage. No known hx of diabetes.

## 2020-03-04 NOTE — Progress Notes (Signed)
39 year old male with past medical history of obesity presented to the emergency room on 5/2 at [facility] with complains of polyuria and polydipsia and found to have a CBG of 929, but also an anion gap of 20.  Stepdown bed ordered, no beds currently available.  Started on insulin drip and fluids.  No evidence of infection.  I have advised ED to call with update if gap resolves and able to be weaned off of insulin drip.  At that point, could potentially downgrade bed status.

## 2020-03-04 NOTE — ED Notes (Signed)
Increased insulin to 12 per endotool.

## 2020-03-04 NOTE — ED Provider Notes (Signed)
[facility] EMERGENCY DEPARTMENT Provider Note   CSN: [id number] Arrival date & time: 03/04/20  1247     History Chief Complaint  Patient presents with  . Hyperglycemia    Louis Vargas is a 39 y.o. male.  Pt presents to the ED today with not feeling well, dizziness, and weakness for about 1 week.  The triage nurse checked his bs and it was over 600.  Pt has no hx of DM, but all of his family members have it.  Pt has been urinating and drinking a lot.  He denies any cp or sob.  He did have Covid in Jan, but feels recovered from that.  Pt denies f/c.        History reviewed. No pertinent past medical history.  There are no problems to display for this patient.   History reviewed. No pertinent surgical history.     No family history on file.  Social History   Tobacco Use  . Smoking status: Never Smoker  . Smokeless tobacco: Never Used  Substance Use Topics  . Alcohol use: Yes    Comment: occ  . Drug use: No    Home Medications Prior to Admission medications   Medication Sig Start Date End Date Taking? Authorizing Provider  benzonatate (TESSALON) 100 MG capsule Take 1 capsule (100 mg total) by mouth every 8 (eight) hours. 11/24/19   Linwood Dibbles, MD  cetirizine-pseudoephedrine (ZYRTEC-D) 5-120 MG tablet Take 1 tablet by mouth daily. 10/20/18   Jeannie Fend, PA-C  cyclobenzaprine (FLEXERIL) 10 MG tablet Take 1 tablet (10 mg total) by mouth 2 (two) times daily as needed for muscle spasms. 06/20/19   Milagros Loll, MD  fluticasone (FLONASE) 50 MCG/ACT nasal spray Place 2 sprays into both nostrils daily. 10/20/18   Jeannie Fend, PA-C  naproxen (NAPROSYN) 500 MG tablet Take 1 tablet (500 mg total) by mouth 2 (two) times daily as needed. 06/20/19   Milagros Loll, MD    Allergies    Ivp dye [iodinated diagnostic agents]  Review of Systems   Review of Systems  Endocrine: Positive for polydipsia and polyuria.  Neurological: Positive for weakness.    All other systems reviewed and are negative.   Physical Exam Updated Vital Signs BP 130/77 (BP Location: Right Arm)   Pulse 83   Temp 98.2 F (36.8 C) (Oral)   Resp 16   Wt 115.7 kg   SpO2 95%   BMI 32.74 kg/m   Physical Exam Vitals and nursing note reviewed.  Constitutional:      Appearance: Normal appearance. He is obese.  HENT:     Head: Normocephalic and atraumatic.     Right Ear: External ear normal.     Left Ear: External ear normal.     Nose: Nose normal.     Mouth/Throat:     Mouth: Mucous membranes are dry.  Eyes:     Extraocular Movements: Extraocular movements intact.     Conjunctiva/sclera: Conjunctivae normal.     Pupils: Pupils are equal, round, and reactive to light.  Cardiovascular:     Rate and Rhythm: Normal rate and regular rhythm.     Pulses: Normal pulses.     Heart sounds: Normal heart sounds.  Pulmonary:     Effort: Pulmonary effort is normal.     Breath sounds: Normal breath sounds.  Abdominal:     General: Abdomen is flat. Bowel sounds are normal.     Palpations: Abdomen is soft.  Musculoskeletal:        General: Normal range of motion.     Cervical back: Normal range of motion and neck supple.  Skin:    General: Skin is warm.     Capillary Refill: Capillary refill takes less than 2 seconds.  Neurological:     General: No focal deficit present.     Mental Status: He is alert and oriented to person, place, and time.  Psychiatric:        Mood and Affect: Mood normal.        Behavior: Behavior normal.        Thought Content: Thought content normal.        Judgment: Judgment normal.     ED Results / Procedures / Treatments   Labs (all labs ordered are listed, but only abnormal results are displayed) Labs Reviewed  CBC WITH DIFFERENTIAL/PLATELET - Abnormal; Notable for the following components:      Result Value   RBC 6.37 (*)    MCV 73.9 (*)    MCH 24.3 (*)    All other components within normal limits  URINALYSIS, ROUTINE W  REFLEX MICROSCOPIC - Abnormal; Notable for the following components:   Color, Urine STRAW (*)    Glucose, UA >=500 (*)    Hgb urine dipstick SMALL (*)    Ketones, ur 40 (*)    All other components within normal limits  URINALYSIS, MICROSCOPIC (REFLEX) - Abnormal; Notable for the following components:   Bacteria, UA FEW (*)    All other components within normal limits  CBG MONITORING, ED - Abnormal; Notable for the following components:   Glucose-Capillary >600 (*)    All other components within normal limits  LIPID PANEL  COMPREHENSIVE METABOLIC PANEL  LIPASE, BLOOD    EKG None  Radiology DG Chest Portable 1 View  Result Date: 03/04/2020 CLINICAL DATA:  Not feeling well with dizziness and urinary frequency 1 week. EXAM: PORTABLE CHEST 1 VIEW COMPARISON:  11/24/2019 FINDINGS: Lungs are adequately inflated and otherwise clear. Cardiomediastinal silhouette and remainder of the exam is unchanged. IMPRESSION: No active disease. Electronically Signed   By: Marin Olp M.D.   On: 03/04/2020 13:55    Procedures Procedures (including critical care time)  Medications Ordered in ED Medications  insulin regular, human (MYXREDLIN) 100 units/ 100 mL infusion (has no administration in time range)  0.9 %  sodium chloride infusion ( Intravenous New Bag/Given 03/04/20 1328)  dextrose 5 %-0.45 % sodium chloride infusion (has no administration in time range)  dextrose 50 % solution 0-50 mL (has no administration in time range)    ED Course  I have reviewed the triage vital signs and the nursing notes.  Pertinent labs & imaging results that were available during my care of the patient were reviewed by me and considered in my medical decision making (see chart for details).    MDM Rules/Calculators/A&P                      Covid positive in Jan, so I did not order a swab.  Pt's BS greater than 600 on CBG.  His blood specimen is too lipemic to run here, so it had to be sent to  [facility].  Endotool has been started.  CRITICAL CARE Performed by: Isla Pence   Total critical care time: 30 minutes  Critical care time was exclusive of separately billable procedures and treating other patients.  Critical care was necessary to treat or prevent  imminent or life-threatening deterioration.  Critical care was time spent personally by me on the following activities: development of treatment plan with patient and/or surrogate as well as nursing, discussions with consultants, evaluation of patient's response to treatment, examination of patient, obtaining history from patient or surrogate, ordering and performing treatments and interventions, ordering and review of laboratory studies, ordering and review of radiographic studies, pulse oximetry and re-evaluation of patient's condition.   Final Clinical Impression(s) / ED Diagnoses Final diagnoses:  Dehydration  New onset of type 2 diabetes mellitus in pediatric patient [facility])    Rx / DC Orders ED Discharge Orders    None       Jacalyn Lefevre, MD 03/04/20 1540

## 2020-03-04 NOTE — ED Notes (Signed)
Insulin changed to 9/hr per endotool.

## 2020-03-04 NOTE — ED Notes (Signed)
Date and time results received: 03/04/20  1655  Test: glucose, potassium Critical Value: 929, 6.4  Name of Provider Notified: Delo Orders Received? Or Actions Taken?:  No orders given

## 2020-03-04 NOTE — ED Notes (Signed)
ED Provider at bedside. 

## 2020-03-04 NOTE — ED Provider Notes (Signed)
Care assumed from Dr. Particia Nearing at shift change.  Patient presenting here with complaints of increased thirst, increased urination, and fatigue for the past week.  His initial blood sugar was greater than 600.  Laboratory studies sent, however could not be performed secondary to lipemia.  For this reason the samples were sent to [facility] and care was signed out to me awaiting these results.  His blood sugar has returned at 929 with a CO2 of 19 and anion gap of 20.  Patient to be started on an insulin drip, IV fluids, and will be admitted to the hospitalist service under the care of Dr. Rito Ehrlich.  He has no prior history of diabetes, but this does run in the family.   Geoffery Lyons, MD 03/04/20 1726

## 2020-03-05 LAB — BASIC METABOLIC PANEL
Anion gap: 15 (ref 5–15)
Anion gap: 13 (ref 5–15)
Anion gap: 13 (ref 5–15)
BUN: 12 mg/dL (ref 6–20)
BUN: 14 mg/dL (ref 6–20)
BUN: 13 mg/dL (ref 6–20)
CO2: 19 mmol/L — ABNORMAL LOW (ref 22–32)
CO2: 22 mmol/L (ref 22–32)
CO2: 17 mmol/L — ABNORMAL LOW (ref 22–32)
Calcium: 9 mg/dL (ref 8.9–10.3)
Calcium: 8.9 mg/dL (ref 8.9–10.3)
Calcium: 8.4 mg/dL — ABNORMAL LOW (ref 8.9–10.3)
Chloride: 99 mmol/L (ref 98–111)
Chloride: 101 mmol/L (ref 98–111)
Chloride: 100 mmol/L (ref 98–111)
Creatinine, Ser: 0.92 mg/dL (ref 0.61–1.24)
Creatinine, Ser: 0.95 mg/dL (ref 0.61–1.24)
Creatinine, Ser: 1.09 mg/dL (ref 0.61–1.24)
GFR calc Af Amer: >60 mL/min (ref >60)
GFR calc Af Amer: >60 mL/min (ref >60)
GFR calc Af Amer: >60 mL/min (ref >60)
GFR calc non Af Amer: >60 mL/min (ref >60)
GFR calc non Af Amer: >60 mL/min (ref >60)
GFR calc non Af Amer: >60 mL/min (ref >60)
Glucose, Bld: 159 mg/dL — ABNORMAL HIGH (ref 70–99)
Glucose, Bld: 172 mg/dL — ABNORMAL HIGH (ref 70–99)
Glucose, Bld: 401 mg/dL — ABNORMAL HIGH (ref 70–99)
Potassium: 3.6 mmol/L (ref 3.5–5.1)
Potassium: 3.5 mmol/L (ref 3.5–5.1)
Potassium: 3.5 mmol/L (ref 3.5–5.1)
Sodium: 133 mmol/L — ABNORMAL LOW (ref 135–145)
Sodium: 136 mmol/L (ref 135–145)
Sodium: 130 mmol/L — ABNORMAL LOW (ref 135–145)

## 2020-03-05 LAB — GLUCOSE, CAPILLARY
Glucose-Capillary: 167 mg/dL — ABNORMAL HIGH (ref 70–99)
Glucose-Capillary: 172 mg/dL — ABNORMAL HIGH (ref 70–99)
Glucose-Capillary: 180 mg/dL — ABNORMAL HIGH (ref 70–99)
Glucose-Capillary: 189 mg/dL — ABNORMAL HIGH (ref 70–99)
Glucose-Capillary: 192 mg/dL — ABNORMAL HIGH (ref 70–99)
Glucose-Capillary: 193 mg/dL — ABNORMAL HIGH (ref 70–99)
Glucose-Capillary: 196 mg/dL — ABNORMAL HIGH (ref 70–99)
Glucose-Capillary: 215 mg/dL — ABNORMAL HIGH (ref 70–99)
Glucose-Capillary: 255 mg/dL — ABNORMAL HIGH (ref 70–99)
Glucose-Capillary: 280 mg/dL — ABNORMAL HIGH (ref 70–99)
Glucose-Capillary: 285 mg/dL — ABNORMAL HIGH (ref 70–99)
Glucose-Capillary: 289 mg/dL — ABNORMAL HIGH (ref 70–99)
Glucose-Capillary: 294 mg/dL — ABNORMAL HIGH (ref 70–99)
Glucose-Capillary: 345 mg/dL — ABNORMAL HIGH (ref 70–99)
Glucose-Capillary: 376 mg/dL — ABNORMAL HIGH (ref 70–99)
Glucose-Capillary: 407 mg/dL — ABNORMAL HIGH (ref 70–99)
Glucose-Capillary: 413 mg/dL — ABNORMAL HIGH (ref 70–99)
Glucose-Capillary: 422 mg/dL — ABNORMAL HIGH (ref 70–99)
Glucose-Capillary: 431 mg/dL — ABNORMAL HIGH (ref 70–99)

## 2020-03-05 LAB — LIPID PANEL
Cholesterol: 365 mg/dL — ABNORMAL HIGH (ref 0–200)
LDL Cholesterol: UNDETERMINED mg/dL (ref 0–99)
Triglycerides: 2321 mg/dL — ABNORMAL HIGH (ref <150)
VLDL: UNDETERMINED mg/dL (ref 0–40)

## 2020-03-05 LAB — CBC
HCT: 44.5 % (ref 39.0–52.0)
Hemoglobin: 13.7 g/dL (ref 13.0–17.0)
MCH: 23.2 pg — ABNORMAL LOW (ref 26.0–34.0)
MCHC: 30.8 g/dL (ref 30.0–36.0)
MCV: 75.4 fL — ABNORMAL LOW (ref 80.0–100.0)
Platelets: 253 10*3/uL (ref 150–400)
RBC: 5.9 MIL/uL — ABNORMAL HIGH (ref 4.22–5.81)
RDW: 13.4 % (ref 11.5–15.5)
WBC: 4.4 10*3/uL (ref 4.0–10.5)
nRBC: 0 % (ref 0.0–0.2)

## 2020-03-05 LAB — BETA-HYDROXYBUTYRIC ACID: Beta-Hydroxybutyric Acid: 2.68 mmol/L — ABNORMAL HIGH (ref 0.05–0.27)

## 2020-03-05 LAB — HEMOGLOBIN A1C
Hgb A1c MFr Bld: 11.9 % — ABNORMAL HIGH (ref 4.8–5.6)
Mean Plasma Glucose: 294.83 mg/dL

## 2020-03-05 LAB — LDL CHOLESTEROL, DIRECT: Direct LDL: UNDETERMINED mg/dL (ref 0–99)

## 2020-03-05 LAB — TRIGLYCERIDES: Triglycerides: 2378 mg/dL — ABNORMAL HIGH (ref <150)

## 2020-03-05 LAB — HIV ANTIBODY (ROUTINE TESTING W REFLEX): HIV Screen 4th Generation wRfx: NONREACTIVE

## 2020-03-05 LAB — MRSA PCR SCREENING: MRSA by PCR: NEGATIVE

## 2020-03-05 MED ORDER — INSULIN (MYXREDLIN) INFUSION FOR HYPERTRIGLYCERIDEMIA
0.1000 [IU]/kg/h | INTRAVENOUS | Status: DC
  Administered 2020-03-05 – 2020-03-07 (×6): 0.1 [IU]/kg/h via INTRAVENOUS
  Filled 2020-03-05 (×6): qty 100

## 2020-03-05 MED ORDER — INSULIN GLARGINE 100 UNIT/ML ~~LOC~~ SOLN
25.0000 [IU] | Freq: Every day | SUBCUTANEOUS | Status: DC
  Administered 2020-03-05: 25 [IU] via SUBCUTANEOUS
  Filled 2020-03-05: qty 0.25

## 2020-03-05 MED ORDER — SODIUM CHLORIDE 0.9 % IV SOLN: INTRAVENOUS | Status: DC

## 2020-03-05 MED ORDER — ACETAMINOPHEN 325 MG PO TABS
650.0000 mg | ORAL_TABLET | Freq: Four times a day (QID) | ORAL | Status: DC | PRN
  Administered 2020-03-05: 650 mg via ORAL
  Filled 2020-03-05: qty 2

## 2020-03-05 MED ORDER — INSULIN ASPART 100 UNIT/ML ~~LOC~~ SOLN: 0.0000 [IU] | Freq: Every day | SUBCUTANEOUS | Status: DC

## 2020-03-05 MED ORDER — INSULIN STARTER KIT- PEN NEEDLES (ENGLISH)
1.0000 | Freq: Once | Status: AC
  Administered 2020-03-05: 1
  Filled 2020-03-05: qty 1

## 2020-03-05 MED ORDER — INSULIN ASPART 100 UNIT/ML ~~LOC~~ SOLN
0.0000 [IU] | Freq: Three times a day (TID) | SUBCUTANEOUS | Status: DC
  Administered 2020-03-05: 4 [IU] via SUBCUTANEOUS

## 2020-03-05 MED ORDER — LIVING WELL WITH DIABETES BOOK
Freq: Once | Status: AC
  Filled 2020-03-05: qty 1

## 2020-03-05 MED ORDER — DEXTROSE 5 % IV SOLN: INTRAVENOUS | Status: DC

## 2020-03-05 NOTE — Progress Notes (Signed)
INFECTION PREVENTION ISOLATION STATUS  Patient has hx of covid-19 in late January. He was retested since he was barely out of 90 day window. Repeat covid test is CT value of 44, which is in the non-infectious/residual viral copies range.  He does not need airborne/contact isolation. Standard precautions for patient. Staff to wear universal mask and EYE PROTECTION  Louis Vargas B. Drue Second MD MPH [facility] [phone]

## 2020-03-05 NOTE — Plan of Care (Signed)
  Problem: Metabolic: Goal: Ability to maintain appropriate glucose levels will improve Outcome: Progressing   Problem: Skin Integrity: Goal: Risk for impaired skin integrity will decrease Outcome: Progressing   

## 2020-03-05 NOTE — Progress Notes (Signed)
Inpatient Diabetes Program Recommendations  AACE/ADA: New Consensus Statement on Inpatient Glycemic Control (2015)  Target Ranges:  Prepandial:   less than 140 mg/dL      Peak postprandial:   less than 180 mg/dL (1-2 hours)      Critically ill patients:  140 - 180 mg/dL   Lab Results  Component Value Date   GLUCAP 345 (H) 03/05/2020   HGBA1C 11.9 (H) 03/04/2020    Review of Glycemic Control  Diabetes history: New-onset DM Outpatient Diabetes medications: None Current orders for Inpatient glycemic control: IV insulin per EndoTool at 0.1 unit/kg/h.  HgbA1C - 11.9%.  Triglycerides 2378. Transitioned off drip and was given Lantus 25 units at 1033 this am. Restarted drip at 1234 with blood sugar of 413 mg/dL and hypertriglyceridemia. D5 when CBGs < 250 mg/dL.   Inpatient Diabetes Program Recommendations:     Insulin drip for hypertriglyceridemia. Blood sugars from 12 noon to present 285-431 mg/dL. Has Lantus 25 units on board from this am. Will need Novolog 0-15 units Q4H.  Follow closely.  Thank you. Ailene Ards, RD, LDN, CDE Inpatient Diabetes Coordinator [phone]

## 2020-03-05 NOTE — Progress Notes (Signed)
PROGRESS NOTE    Louis Vargas  UXN:[id number] DOB: Sep 07, 1981 DOA: 03/04/2020 PCP: Patient, No Pcp Per   Brief Narrative: Patient is a 39 year old male with history of obesity, covid  infection 11/24/2019 who presented to med [facility] ED with complaints of polyuria, polydipsia, lethargy.  Reported 2 to 3 days history of frequent urination, excessive thirst.  No history of personal chronic medical conditions.  Diabetes in the family.  He had Covid on January this year but had mild symptoms for which he was seen in the ED and did not require any treatment.  On Presentation, he was hemodynamically stable.  Found to have hyperglycemia, hyperkalemia, hypertriglyceridemia.  Covid PCR was positive.  Chest x-ray did not show any consolidation.  Admitted for the management of DKA with insulin drip.  He was also found to have severe hypertriglyceridemia so our plan is to continue insulin drip for today.  Assessment & Plan:   Principal Problem:   DKA (diabetic ketoacidoses) (HCC) Active Problems:   Obesity (BMI 30-39.9)   Acute kidney injury (HCC)   Hyperkalemia   Lab test positive for detection of COVID-19 virus   New onset diabetes/DKA: No history of diabetes in the past.  Presented with polyuria, polydipsia, hypoglycemia, AKI.  Started on insulin drip.  Gap has closed.  Insulin transitioned to Lantus and sliding scale.  Diabetic coordinator consulted.  Hemoglobin A1c of 11.9.  He needs insulin on discharge.  AKI: In the setting of dehydration from excessive diuresis.  Continue IV fluids.  AKI resolved  Hyperkalemia: Resolved  Hypertriglyceridemia: In the setting of diabetic ketoacidosis  and obesity.lipid panel rechecked today shows severe hypertriglyceridemia so continuing insulin drip.  Monitor sugars.  If needed will use D5 or D10.  Positive Covid test: Previously positive on 11/2019.  Again positive.  Does not have any symptoms.No need for isolation          DVT prophylaxis:  Heparin [place] Code Status: Full Family Communication:  Status is: Inpatient  Remains inpatient appropriate because:IV treatments appropriate due to intensity of illness or inability to take PO   Dispo: The patient is from: Home              Anticipated d/c is to: Home              Anticipated d/c date is: 1 day              Patient currently is not medically stable to d/c.  Needs insulin drip for severe hypertriglyceridemia.  Potential discharge tomorrow.    Consultants: None  Procedures:None  Antimicrobials:  Anti-infectives (From admission, onward)   None      Subjective: Patient seen and examined at the bedside this morning.  Hemodynamically stable.  Comfortable.  Denies any complaints.  Objective: Vitals:   03/04/20 2130 03/04/20 2200 03/05/20 0000 03/05/20 0400  BP: (!) 147/81 (!) 149/94 136/67 (!) 165/73  Pulse: 80 90 85 74  Resp: 15 13 17 19   Temp:   97.8 F (36.6 C) 98.3 F (36.8 C)  TempSrc:   Axillary Axillary  SpO2: 98% 96% 96% 98%  Weight: 118.6 kg     Height: 6\' 1"  (1.854 m)       Intake/Output Summary (Last 24 hours) at 03/05/2020 0751 Last data filed at 03/05/2020 0042 Gross per 24 hour  Intake 1749.99 ml  Output --  Net 1749.99 ml   Filed Weights   03/04/20 1311 03/04/20 2130  Weight: 115.7 kg 118.6 kg  Examination:  General exam: Appears calm and comfortable ,Not in distress,obese HEENT:PERRL,Oral mucosa moist, Ear/Nose normal on gross exam Respiratory system: Bilateral equal air entry, normal vesicular breath sounds, no wheezes or crackles  Cardiovascular system: S1 & S2 heard, RRR. No JVD, murmurs, rubs, gallops or clicks. No pedal edema. Gastrointestinal system: Abdomen is nondistended, soft and nontender. No organomegaly or masses felt. Normal bowel sounds heard. Central nervous system: Alert and oriented. No focal neurological deficits. Extremities: No edema, no clubbing ,no cyanosis, distal peripheral pulses palpable. Skin: No rashes,  lesions or ulcers,no icterus ,no pallor    Data Reviewed: I have personally reviewed following labs and imaging studies  CBC: Recent Labs  Lab 03/04/20 1321 03/05/20 0641  WBC 5.3 4.4  NEUTROABS 3.0  --   HGB 15.5 13.7  HCT 47.1 44.5  MCV 73.9* 75.4*  PLT 312 253   Basic Metabolic Panel: Recent Labs  Lab 03/04/20 1416 03/04/20 2242 03/05/20 0248 03/05/20 0641  NA 131* 133* 133* 136  K 6.4* 4.1 3.6 3.5  CL 92* 100 99 101  CO2 19* 20* 19* 22  GLUCOSE 929* 246* 159* 172*  BUN 20 14 12 14   CREATININE 1.52* 1.05 0.92 0.95  CALCIUM 9.7 9.2 9.0 8.9   GFR: Estimated Creatinine Clearance: 140.9 mL/min (by C-G formula based on SCr of 0.95 mg/dL). Liver Function Tests: Recent Labs  Lab 03/04/20 1416  AST 42*  ALT 65*  ALKPHOS 102  BILITOT 2.0*  PROT 7.5  ALBUMIN 4.4   Recent Labs  Lab 03/04/20 1416  LIPASE 29   No results for input(s): AMMONIA in the last 168 hours. Coagulation Profile: No results for input(s): INR, PROTIME in the last 168 hours. Cardiac Enzymes: No results for input(s): CKTOTAL, CKMB, CKMBINDEX, TROPONINI in the last 168 hours. BNP (last 3 results) No results for input(s): PROBNP in the last 8760 hours. HbA1C: Recent Labs    03/04/20 2242  HGBA1C 11.9*   CBG: Recent Labs  Lab 03/05/20 0232 03/05/20 0336 03/05/20 0433 03/05/20 0533 03/05/20 0642  GLUCAP 192* 172* 167* 193* 180*   Lipid Profile: Recent Labs    03/04/20 1416  CHOL 471*  HDL NOT REPORTED DUE TO HIGH TRIGLYCERIDES  LDLCALC NOT CALCULATED  TRIG >5,000*  CHOLHDL NOT REPORTED DUE TO HIGH TRIGLYCERIDES   Thyroid Function Tests: No results for input(s): TSH, T4TOTAL, FREET4, T3FREE, THYROIDAB in the last 72 hours. Anemia Panel: Recent Labs    03/04/20 2006  FERRITIN 802*   Sepsis Labs: No results for input(s): PROCALCITON, LATICACIDVEN in the last 168 hours.  Recent Results (from the past 240 hour(s))  Respiratory Panel by RT PCR (Flu A&B, Covid) -  Nasopharyngeal Swab     Status: Abnormal   Collection Time: 03/04/20  5:15 PM   Specimen: Nasopharyngeal Swab  Result Value Ref Range Status   SARS Coronavirus 2 by RT PCR POSITIVE (A) NEGATIVE Final    Comment: RESULT CALLED TO, READ BACK BY AND VERIFIED WITH: 05/04/20 RN @ 1820 ON 03/04/2020 BY Punjtan,G (NOTE) SARS-CoV-2 target nucleic acids are DETECTED. SARS-CoV-2 RNA is generally detectable in upper respiratory specimens  during the acute phase of infection. Positive results are indicative of the presence of the identified virus, but do not rule out bacterial infection or co-infection with other pathogens not detected by the test. Clinical correlation with patient history and other diagnostic information is necessary to determine patient infection status. The expected result is Negative. Fact Sheet for Patients:  05/04/2020 Fact  Sheet for Healthcare Providers: GravelBags.it This test is not yet approved or cleared by the [place] and  has been authorized for detection and/or diagnosis of SARS-CoV-2 by FDA under an Emergency Use Authorization (EUA).  This EUA will remain in effect (meaning this test ca n be used) for the duration of  the COVID-19 declaration under Section 564(b)(1) of the Act, 21 U.S.C. section 360bbb-3(b)(1), unless the authorization is terminated or revoked sooner.    Influenza A by PCR NEGATIVE NEGATIVE Final   Influenza B by PCR NEGATIVE NEGATIVE Final    Comment: (NOTE) The Xpert Xpress SARS-CoV-2/FLU/RSV assay is intended as an aid in  the diagnosis of influenza from Nasopharyngeal swab specimens and  should not be used as a sole basis for treatment. Nasal washings and  aspirates are unacceptable for Xpert Xpress SARS-CoV-2/FLU/RSV  testing. Fact Sheet for Patients: PinkCheek.be Fact Sheet for Healthcare  Providers: GravelBags.it This test is not yet approved or cleared by the [place] FDA and  has been authorized for detection and/or diagnosis of SARS-CoV-2 by  FDA under an Emergency Use Authorization (EUA). This EUA will remain  in effect (meaning this test can be used) for the duration of the  Covid-19 declaration under Section 564(b)(1) of the Act, 21  U.S.C. section 360bbb-3(b)(1), unless the authorization is  terminated or revoked. Performed at [facility], [address] [place], [place] [address]   MRSA PCR Screening     Status: None   Collection Time: 03/04/20  9:37 PM   Specimen: Nasal Mucosa; Nasopharyngeal  Result Value Ref Range Status   MRSA by PCR NEGATIVE NEGATIVE Final    Comment: Performed at [facility], [place] [address] [place], [place] [address]         Radiology Studies: DG Chest Portable 1 View  Result Date: 03/04/2020 CLINICAL DATA:  Not feeling well with dizziness and urinary frequency 1 week. EXAM: PORTABLE CHEST 1 VIEW COMPARISON:  11/24/2019 FINDINGS: Lungs are adequately inflated and otherwise clear. Cardiomediastinal silhouette and remainder of the exam is unchanged. IMPRESSION: No active disease. Electronically Signed   By: Marin Olp M.D.   On: 03/04/2020 13:55        Scheduled Meds: . Chlorhexidine Gluconate Cloth  6 each Topical Daily  . heparin  5,000 Units Subcutaneous Q8H  . insulin aspart  0-20 Units Subcutaneous TID WC  . insulin aspart  0-5 Units Subcutaneous QHS  . insulin glargine  25 Units Subcutaneous Daily  . mouth rinse  15 mL Mouth Rinse BID   Continuous Infusions: . sodium chloride Stopped (03/05/20 0747)     LOS: 1 day    Time spent: 35 mins.More than 50% of that time was spent in counseling and/or coordination of care.      Shelly Coss, MD [facility] P5/01/2020, 7:52 AM

## 2020-03-05 NOTE — Progress Notes (Signed)
Notified MD in regards to pt CBG in the 400s. Insulin being started for hypertriglyceridemia. D5 ordered, but not started due to CBG. NS ordered instead. Will change over to D5 once CBGs below 250

## 2020-03-05 NOTE — Progress Notes (Signed)
Pt provided with Living with Diabetes booklet. Pt verbalized he would read through and ask any questions

## 2020-03-06 LAB — BASIC METABOLIC PANEL
Anion gap: 14 (ref 5–15)
Anion gap: 9 (ref 5–15)
BUN: 12 mg/dL (ref 6–20)
BUN: 11 mg/dL (ref 6–20)
CO2: 20 mmol/L — ABNORMAL LOW (ref 22–32)
CO2: 23 mmol/L (ref 22–32)
Calcium: 8.7 mg/dL — ABNORMAL LOW (ref 8.9–10.3)
Calcium: 8.8 mg/dL — ABNORMAL LOW (ref 8.9–10.3)
Chloride: 101 mmol/L (ref 98–111)
Chloride: 103 mmol/L (ref 98–111)
Creatinine, Ser: 0.79 mg/dL (ref 0.61–1.24)
Creatinine, Ser: 1.03 mg/dL (ref 0.61–1.24)
GFR calc Af Amer: >60 mL/min (ref >60)
GFR calc Af Amer: >60 mL/min (ref >60)
GFR calc non Af Amer: >60 mL/min (ref >60)
GFR calc non Af Amer: >60 mL/min (ref >60)
Glucose, Bld: 219 mg/dL — ABNORMAL HIGH (ref 70–99)
Glucose, Bld: 308 mg/dL — ABNORMAL HIGH (ref 70–99)
Potassium: 3.2 mmol/L — ABNORMAL LOW (ref 3.5–5.1)
Potassium: 3.5 mmol/L (ref 3.5–5.1)
Sodium: 135 mmol/L (ref 135–145)
Sodium: 135 mmol/L (ref 135–145)

## 2020-03-06 LAB — GLUCOSE, CAPILLARY
Glucose-Capillary: 138 mg/dL — ABNORMAL HIGH (ref 70–99)
Glucose-Capillary: 157 mg/dL — ABNORMAL HIGH (ref 70–99)
Glucose-Capillary: 163 mg/dL — ABNORMAL HIGH (ref 70–99)
Glucose-Capillary: 185 mg/dL — ABNORMAL HIGH (ref 70–99)
Glucose-Capillary: 195 mg/dL — ABNORMAL HIGH (ref 70–99)
Glucose-Capillary: 211 mg/dL — ABNORMAL HIGH (ref 70–99)
Glucose-Capillary: 224 mg/dL — ABNORMAL HIGH (ref 70–99)
Glucose-Capillary: 235 mg/dL — ABNORMAL HIGH (ref 70–99)
Glucose-Capillary: 262 mg/dL — ABNORMAL HIGH (ref 70–99)
Glucose-Capillary: 270 mg/dL — ABNORMAL HIGH (ref 70–99)
Glucose-Capillary: 273 mg/dL — ABNORMAL HIGH (ref 70–99)
Glucose-Capillary: 274 mg/dL — ABNORMAL HIGH (ref 70–99)
Glucose-Capillary: 286 mg/dL — ABNORMAL HIGH (ref 70–99)
Glucose-Capillary: 294 mg/dL — ABNORMAL HIGH (ref 70–99)
Glucose-Capillary: 297 mg/dL — ABNORMAL HIGH (ref 70–99)
Glucose-Capillary: 300 mg/dL — ABNORMAL HIGH (ref 70–99)
Glucose-Capillary: 300 mg/dL — ABNORMAL HIGH (ref 70–99)
Glucose-Capillary: 321 mg/dL — ABNORMAL HIGH (ref 70–99)
Glucose-Capillary: 329 mg/dL — ABNORMAL HIGH (ref 70–99)

## 2020-03-06 LAB — CBC WITH DIFFERENTIAL/PLATELET
Abs Immature Granulocytes: 0.01 10*3/uL (ref 0.00–0.07)
Basophils Absolute: 0 10*3/uL (ref 0.0–0.1)
Basophils Relative: 1 %
Eosinophils Absolute: 0.1 10*3/uL (ref 0.0–0.5)
Eosinophils Relative: 4 %
HCT: 41.7 % (ref 39.0–52.0)
Hemoglobin: 12.7 g/dL — ABNORMAL LOW (ref 13.0–17.0)
Immature Granulocytes: 0 %
Lymphocytes Relative: 53 %
Lymphs Abs: 1.7 10*3/uL (ref 0.7–4.0)
MCH: 23.1 pg — ABNORMAL LOW (ref 26.0–34.0)
MCHC: 30.5 g/dL (ref 30.0–36.0)
MCV: 75.8 fL — ABNORMAL LOW (ref 80.0–100.0)
Monocytes Absolute: 0.3 10*3/uL (ref 0.1–1.0)
Monocytes Relative: 9 %
Neutro Abs: 1.1 10*3/uL — ABNORMAL LOW (ref 1.7–7.7)
Neutrophils Relative %: 33 %
Platelets: 210 10*3/uL (ref 150–400)
RBC: 5.5 MIL/uL (ref 4.22–5.81)
RDW: 13.3 % (ref 11.5–15.5)
WBC: 3.2 10*3/uL — ABNORMAL LOW (ref 4.0–10.5)
nRBC: 0 % (ref 0.0–0.2)

## 2020-03-06 LAB — TRIGLYCERIDES
Triglycerides: 1331 mg/dL — ABNORMAL HIGH (ref <150)
Triglycerides: 1212 mg/dL — ABNORMAL HIGH (ref <150)

## 2020-03-06 MED ORDER — POTASSIUM CHLORIDE CRYS ER 20 MEQ PO TBCR
40.0000 meq | EXTENDED_RELEASE_TABLET | Freq: Once | ORAL | Status: AC
  Administered 2020-03-06: 40 meq via ORAL
  Filled 2020-03-06: qty 2

## 2020-03-06 MED ORDER — FENOFIBRATE 160 MG PO TABS
160.0000 mg | ORAL_TABLET | Freq: Every day | ORAL | Status: DC
  Administered 2020-03-06 – 2020-03-07 (×2): 160 mg via ORAL
  Filled 2020-03-06 (×2): qty 1

## 2020-03-06 NOTE — Progress Notes (Signed)
Inpatient Diabetes Program Recommendations  AACE/ADA: New Consensus Statement on Inpatient Glycemic Control (2015)  Target Ranges:  Prepandial:   less than 140 mg/dL      Peak postprandial:   less than 180 mg/dL (1-2 hours)      Critically ill patients:  140 - 180 mg/dL   Lab Results  Component Value Date   GLUCAP 262 (H) 03/06/2020   HGBA1C 11.9 (H) 03/04/2020    Review of Glycemic Control  Spoke with patient about new diabetes diagnosis.  Discussed A1C results (11.9%) and explained what an A1C is and informed patient that his current A1C indicates an average glucose of 240 mg/dl over the past 2-3 months. Discussed basic pathophysiology of DM Type 2, basic home care, importance of checking CBGs and maintaining good CBG control to prevent long-term and short-term complications. Reviewed glucose and A1C goals and explained that patient will need to continue to  Reviewed signs and symptoms of hyperglycemia and hypoglycemia along with treatment for both. Discussed impact of nutrition, exercise, stress, sickness, and medications on diabetes control. Reviewed Living Well with diabetes booklet and encouraged patient to read through entire book.   Recommendations for discharge:  Novolin 70/30 (pen) 20 units ac breakfast and dinner #[id number] Novolin R (pen) s/s (0-15 units tidwc) #193790 Glucose meter #[id number] Insulin pen needles #[id number]  F/U with PCP within a week. Take CBG logbook to appt  Thank you. Ailene Ards, RD, LDN, CDE Inpatient Diabetes Coordinator [phone]

## 2020-03-06 NOTE — Progress Notes (Signed)
PROGRESS NOTE    Louis Vargas  TIR:[id number] DOB: December 30, 1980 DOA: 03/04/2020 PCP: Patient, No Pcp Per   Brief Narrative: Patient is a 39 year old male with history of obesity, covid  infection 11/24/2019 who presented to med [facility] ED with complaints of polyuria, polydipsia, lethargy.  Reported 2 to 3 days history of frequent urination, excessive thirst.  No history of personal chronic medical conditions.  Diabetes in the family.  He had Covid on January this year but had mild symptoms for which he was seen in the ED and did not require any treatment.  On Presentation, he was hemodynamically stable.  Found to have hyperglycemia, hyperkalemia, hypertriglyceridemia.  Covid PCR was positive.  Chest x-ray did not show any consolidation.  Admitted for the management of DKA with insulin drip.  He was also found to have severe hypertriglyceridemia so our plan is to continue insulin drip for today.  Assessment & Plan:   Principal Problem:   DKA (diabetic ketoacidoses) (HCC) Active Problems:   Obesity (BMI 30-39.9)   Acute kidney injury (HCC)   Hyperkalemia   Lab test positive for detection of COVID-19 virus   New onset diabetes/DKA: No history of diabetes in the past.  Presented with polyuria, polydipsia, hypoglycemia, AKI.  Started on insulin drip.  Gap has closed.  Insulin transitioned to Lantus and sliding scale.  Diabetic coordinator consulted.  Hemoglobin A1c of 11.9.  He needs insulin on discharge.  AKI: In the setting of dehydration from excessive diuresis.  Continue IV fluids.  AKI resolved  Hyperkalemia: Resolved  Hypertriglyceridemia: In the setting of diabetic ketoacidosis  and obesity.lipid panel rechecked and comfirmed  severe hypertriglyceridemia ,so continuing insulin drip.  Monitor sugars.  If needed will use D5 or D10.  We will continue insulin drip until triglyceride level falls below 1000.  Positive Covid test: Previously positive on 11/2019.  Again positive.  Does not  have any symptoms.No need for isolation          DVT prophylaxis: Heparin [place] Code Status: Full Family Communication: Discussed with the patient Status is: Inpatient  Remains inpatient appropriate because:IV treatments appropriate due to intensity of illness or inability to take PO   Dispo: The patient is from: Home              Anticipated d/c is to: Home              Anticipated d/c date is: 1 day              Patient currently is not medically stable to d/c.  Needs insulin drip for severe hypertriglyceridemia.  Potential discharge tomorrow.    Consultants: None  Procedures:None  Antimicrobials:  Anti-infectives (From admission, onward)   None      Subjective: Patient seen and examined at the bedside this morning.  Hemodynamically stable.  Denies any complaints  Objective: Vitals:   03/06/20 0900 03/06/20 1000 03/06/20 1100 03/06/20 1200  BP: (!) 150/97 (!) 165/51 (!) 157/71 (!) 132/39  Pulse: 86 82 85 82  Resp: (!) 23 18 15 15   Temp:    97.9 F (36.6 C)  TempSrc:    Oral  SpO2: 96% 92% 95% 97%  Weight:      Height:        Intake/Output Summary (Last 24 hours) at 03/06/2020 1355 Last data filed at 03/06/2020 0741 Gross per 24 hour  Intake 2106.47 ml  Output 2000 ml  Net 106.47 ml   Filed Weights   03/04/20  1311 03/04/20 2130  Weight: 115.7 kg 118.6 kg    Examination:  General exam: Appears calm and comfortable ,Not in distress,obese HEENT:PERRL,Oral mucosa moist, Ear/Nose normal on gross exam Respiratory system: Bilateral equal air entry, normal vesicular breath sounds, no wheezes or crackles  Cardiovascular system: S1 & S2 heard, RRR. No JVD, murmurs, rubs, gallops or clicks. Gastrointestinal system: Abdomen is nondistended, soft and nontender. No organomegaly or masses felt. Normal bowel sounds heard. Central nervous system: Alert and oriented. No focal neurological deficits. Extremities: No edema, no clubbing ,no cyanosis, distal peripheral pulses  palpable. Skin: No rashes, lesions or ulcers,no icterus ,no pallor   Data Reviewed: I have personally reviewed following labs and imaging studies  CBC: Recent Labs  Lab 03/04/20 1321 03/05/20 0641 03/06/20 0136  WBC 5.3 4.4 3.2*  NEUTROABS 3.0  --  1.1*  HGB 15.5 13.7 12.7*  HCT 47.1 44.5 41.7  MCV 73.9* 75.4* 75.8*  PLT 312 253 210   Basic Metabolic Panel: Recent Labs  Lab 03/05/20 0248 03/05/20 0641 03/05/20 1540 03/06/20 0136 03/06/20 1156  NA 133* 136 130* 135 135  K 3.6 3.5 3.5 3.2* 3.5  CL 99 101 100 101 103  CO2 19* 22 17* 20* 23  GLUCOSE 159* 172* 401* 219* 308*  BUN 12 14 13 12 11   CREATININE 0.92 0.95 1.09 0.79 1.03  CALCIUM 9.0 8.9 8.4* 8.7* 8.8*   GFR: Estimated Creatinine Clearance: 129.9 mL/min (by C-G formula based on SCr of 1.03 mg/dL). Liver Function Tests: Recent Labs  Lab 03/04/20 1416  AST 42*  ALT 65*  ALKPHOS 102  BILITOT 2.0*  PROT 7.5  ALBUMIN 4.4   Recent Labs  Lab 03/04/20 1416  LIPASE 29   No results for input(s): AMMONIA in the last 168 hours. Coagulation Profile: No results for input(s): INR, PROTIME in the last 168 hours. Cardiac Enzymes: No results for input(s): CKTOTAL, CKMB, CKMBINDEX, TROPONINI in the last 168 hours. BNP (last 3 results) No results for input(s): PROBNP in the last 8760 hours. HbA1C: Recent Labs    03/04/20 2242  HGBA1C 11.9*   CBG: Recent Labs  Lab 03/06/20 0741 03/06/20 0840 03/06/20 0942 03/06/20 1047 03/06/20 1157  GLUCAP 163* 138* 235* 262* 297*   Lipid Profile: Recent Labs    03/04/20 1416 03/04/20 1416 03/05/20 0641 03/05/20 1759 03/06/20 0136 03/06/20 1156  CHOL 471*  --  365*  --   --   --   HDL NOT REPORTED DUE TO HIGH TRIGLYCERIDES  --  NOT REPORTED DUE TO HIGH TRIGLYCERIDES  --   --   --   LDLCALC NOT CALCULATED  --  UNABLE TO CALCULATE IF TRIGLYCERIDE OVER 400 mg/dL  --   --   --   TRIG 05/06/20*   < > 2,321*   < > 1,331* 1,212*  CHOLHDL NOT REPORTED DUE TO HIGH  TRIGLYCERIDES  --  NOT REPORTED DUE TO HIGH TRIGLYCERIDES  --   --   --   LDLDIRECT  --   --  UNABLE TO CALCULATE IF TRIGLYCERIDE IS >1293 mg/dL  --   --   --    < > = values in this interval not displayed.   Thyroid Function Tests: No results for input(s): TSH, T4TOTAL, FREET4, T3FREE, THYROIDAB in the last 72 hours. Anemia Panel: Recent Labs    03/04/20 2006  FERRITIN 802*   Sepsis Labs: No results for input(s): PROCALCITON, LATICACIDVEN in the last 168 hours.  Recent Results (from the past  240 hour(s))  Respiratory Panel by RT PCR (Flu A&B, Covid) - Nasopharyngeal Swab     Status: Abnormal   Collection Time: 03/04/20  5:15 PM   Specimen: Nasopharyngeal Swab  Result Value Ref Range Status   SARS Coronavirus 2 by RT PCR POSITIVE (A) NEGATIVE Final    Comment: RESULT CALLED TO, READ BACK BY AND VERIFIED WITH: Carlene Coria RN @ 6010 ON 03/04/2020 BY Punjtan,G (NOTE) SARS-CoV-2 target nucleic acids are DETECTED. SARS-CoV-2 RNA is generally detectable in upper respiratory specimens  during the acute phase of infection. Positive results are indicative of the presence of the identified virus, but do not rule out bacterial infection or co-infection with other pathogens not detected by the test. Clinical correlation with patient history and other diagnostic information is necessary to determine patient infection status. The expected result is Negative. Fact Sheet for Patients:  PinkCheek.be Fact Sheet for Healthcare Providers: GravelBags.it This test is not yet approved or cleared by the [place] FDA and  has been authorized for detection and/or diagnosis of SARS-CoV-2 by FDA under an Emergency Use Authorization (EUA).  This EUA will remain in effect (meaning this test ca n be used) for the duration of  the COVID-19 declaration under Section 564(b)(1) of the Act, 21 U.S.C. section 360bbb-3(b)(1), unless the authorization  is terminated or revoked sooner.    Influenza A by PCR NEGATIVE NEGATIVE Final   Influenza B by PCR NEGATIVE NEGATIVE Final    Comment: (NOTE) The Xpert Xpress SARS-CoV-2/FLU/RSV assay is intended as an aid in  the diagnosis of influenza from Nasopharyngeal swab specimens and  should not be used as a sole basis for treatment. Nasal washings and  aspirates are unacceptable for Xpert Xpress SARS-CoV-2/FLU/RSV  testing. Fact Sheet for Patients: PinkCheek.be Fact Sheet for Healthcare Providers: GravelBags.it This test is not yet approved or cleared by the [place] FDA and  has been authorized for detection and/or diagnosis of SARS-CoV-2 by  FDA under an Emergency Use Authorization (EUA). This EUA will remain  in effect (meaning this test can be used) for the duration of the  Covid-19 declaration under Section 564(b)(1) of the Act, 21  U.S.C. section 360bbb-3(b)(1), unless the authorization is  terminated or revoked. Performed at [facility], [address] [place], [place] [address]   MRSA PCR Screening     Status: None   Collection Time: 03/04/20  9:37 PM   Specimen: Nasal Mucosa; Nasopharyngeal  Result Value Ref Range Status   MRSA by PCR NEGATIVE NEGATIVE Final    Comment: Performed at [facility], [place] [address] [place], [place] [address]         Radiology Studies: No results found.      Scheduled Meds: . Chlorhexidine Gluconate Cloth  6 each Topical Daily  . fenofibrate  160 mg Oral Daily  . heparin  5,000 Units Subcutaneous Q8H  . mouth rinse  15 mL Mouth Rinse BID   Continuous Infusions: . sodium chloride Stopped (03/06/20 0225)  . dextrose 100 mL/hr at 03/06/20 0400  . insulin 0.1 Units/kg/hr (03/06/20 1337)     LOS: 2 days    Time spent: 35 mins.More than 50% of that time was spent in counseling and/or coordination of care.      Shelly Coss,  MD [facility] P5/02/2020, 1:55 PM

## 2020-03-07 ENCOUNTER — Encounter (HOSPITAL_COMMUNITY): Payer: Self-pay

## 2020-03-07 LAB — BASIC METABOLIC PANEL
Anion gap: 18 — ABNORMAL HIGH (ref 5–15)
BUN: 9 mg/dL (ref 6–20)
CO2: 16 mmol/L — ABNORMAL LOW (ref 22–32)
Calcium: 8.1 mg/dL — ABNORMAL LOW (ref 8.9–10.3)
Chloride: 108 mmol/L (ref 98–111)
Creatinine, Ser: 0.8 mg/dL (ref 0.61–1.24)
GFR calc Af Amer: >60 mL/min (ref >60)
GFR calc non Af Amer: >60 mL/min (ref >60)
Glucose, Bld: 253 mg/dL — ABNORMAL HIGH (ref 70–99)
Potassium: 3.4 mmol/L — ABNORMAL LOW (ref 3.5–5.1)
Sodium: 142 mmol/L (ref 135–145)

## 2020-03-07 LAB — GLUCOSE, CAPILLARY
Glucose-Capillary: 163 mg/dL — ABNORMAL HIGH (ref 70–99)
Glucose-Capillary: 201 mg/dL — ABNORMAL HIGH (ref 70–99)
Glucose-Capillary: 230 mg/dL — ABNORMAL HIGH (ref 70–99)

## 2020-03-07 LAB — TRIGLYCERIDES: Triglycerides: 1312 mg/dL — ABNORMAL HIGH (ref <150)

## 2020-03-07 MED ORDER — NOVOLIN 70/30 FLEXPEN RELION (70-30) 100 UNIT/ML ~~LOC~~ SUPN: 20.0000 [IU] | PEN_INJECTOR | Freq: Two times a day (BID) | SUBCUTANEOUS | 1 refills | Status: DC

## 2020-03-07 MED ORDER — FENOFIBRATE 160 MG PO TABS: 160.0000 mg | ORAL_TABLET | Freq: Every day | ORAL | 1 refills | Status: DC

## 2020-03-07 MED ORDER — ATORVASTATIN CALCIUM 40 MG PO TABS
40.0000 mg | ORAL_TABLET | Freq: Every day | ORAL | 1 refills | Status: DC
Start: 2020-03-07 — End: 2021-11-13

## 2020-03-07 MED ORDER — NOVOLIN R FLEXPEN RELION 100 UNIT/ML IJ SOPN: 0.0000 [IU] | PEN_INJECTOR | Freq: Three times a day (TID) | INTRAMUSCULAR | 1 refills | Status: DC

## 2020-03-07 MED ORDER — "PEN NEEDLES 3/16"" 31G X 5 MM MISC": 1.0000 | Freq: Three times a day (TID) | 0 refills | Status: DC

## 2020-03-07 MED ORDER — INSULIN ASPART PROT & ASPART (70-30 MIX) 100 UNIT/ML ~~LOC~~ SUSP
20.0000 [IU] | Freq: Two times a day (BID) | SUBCUTANEOUS | Status: DC
  Administered 2020-03-07: 09:00:00 20 [IU] via SUBCUTANEOUS
  Filled 2020-03-07: qty 10

## 2020-03-07 MED ORDER — POTASSIUM CHLORIDE CRYS ER 20 MEQ PO TBCR
40.0000 meq | EXTENDED_RELEASE_TABLET | Freq: Once | ORAL | Status: AC
  Administered 2020-03-07: 40 meq via ORAL
  Filled 2020-03-07: qty 2

## 2020-03-07 MED ORDER — BLOOD GLUCOSE METER KIT: PACK | 0 refills | Status: DC

## 2020-03-07 NOTE — TOC Transition Note (Signed)
Transition of Care [facility]) - CM/SW Discharge Note   Patient Details  Name: Louis Vargas MRN: [id number] Date of Birth: December 18, 1980  Transition of Care [facility]) CM/SW Contact:  Golda Acre, RN Phone Number: 03/07/2020, 11:15 AM   Clinical Narrative:    dcd to home   Final next level of care: Home/Self Care Barriers to Discharge: No Barriers Identified   Patient Goals and CMS Choice Patient states their goals for this hospitalization and ongoing recovery are:: to go home and get better CMS Medicare.gov Compare Post Acute Care list provided to:: Patient Choice offered to / list presented to : Patient  Discharge Placement                       Discharge Plan and Services                                     Social Determinants of Health (SDOH) Interventions     Readmission Risk Interventions No flowsheet data found.

## 2020-03-07 NOTE — Discharge Summary (Signed)
Physician Discharge Summary  Louis Vargas WJX:[id number] DOB: 09/30/1981 DOA: 03/04/2020  PCP: Patient, No Pcp Per  Admit date: 03/04/2020 Discharge date: 03/07/2020  Admitted From: Home Disposition:  Home  Discharge Condition:Stable CODE STATUS:FULL Diet recommendation: Carb Modified  Brief/Interim Summary: Patient is a 39 year old male with history of obesity, covid  infection 11/24/2019 who presented to [facility] ED with complaints of polyuria, polydipsia, lethargy.  Reported 2 to 3 days history of frequent urination, excessive thirst.  No history of personal chronic medical conditions but diabetes in the family.  He had Covid on January this year but had mild symptoms for which he was seen in the ED and did not require any treatment.  On Presentation, he was hemodynamically stable.  Found to have hyperglycemia, hyperkalemia, hypertriglyceridemia.  Covid PCR was positive but he was inside 21-90 days frame  so not isolated or treated . Admitted for the management of DKA with insulin drip.  He was also found to have severe hypertriglyceridemia so insulin drip was continued.  His triglycerides remain in the range of 1200.  He has been started on fenofibrate and statin.  Diabetic coordinator was following and he has been started on insulin which she will continue at home.  She is hemodynamically stable for discharge to home today.  Following problems were addressed during this hospitalization:   New onset diabetes/DKA: No history of diabetes in the past.  Presented with polyuria, polydipsia, hypoglycemia, AKI.  Started on insulin drip.  Gap has closed.  Insulin transitioned to 70/30. Diabetic coordinator consulted and was following.  Hemoglobin A1c of 11.9.  He is being discharged with insulin.  Supplies prescribed  AKI: In the setting of dehydration from excessive diuresis.  Continue IV fluids.  AKI resolved  Hyperkalemia: Resolved  Hypertriglyceridemia: In the setting of diabetic  ketoacidosis  and obesity.lipid panel rechecked and comfirmed  severe hypertriglyceridemia ,treated with insulin drip.    Started on fenofibrate and statin on discharge.  Check lipid panel in a week.  Positive Covid test: Previously positive on 11/2019.  Again positive.  Does not have any symptoms.No need for isolation  Discharge Diagnoses:  Principal Problem:   DKA (diabetic ketoacidoses) ([place]) Active Problems:   Obesity (BMI 30-39.9)   Acute kidney injury ([place])   Hyperkalemia   Lab test positive for detection of COVID-19 virus    Discharge Instructions  Discharge Instructions    Diet Carb Modified   Complete by: As directed    Discharge instructions   Complete by: As directed    1)Please follow up with your PCP in a week.Do a lipid panel test during the follow up. 2)Take prescribed  Medications as instructed. 3)Monitor your blood glucose at home.   Increase activity slowly   Complete by: As directed      Allergies as of 03/07/2020      Reactions   Ivp Dye [iodinated Diagnostic Agents]       Medication List    STOP taking these medications   benzonatate 100 MG capsule Commonly known as: TESSALON   cetirizine-pseudoephedrine 5-120 MG tablet Commonly known as: ZYRTEC-D   cyclobenzaprine 10 MG tablet Commonly known as: FLEXERIL   fluticasone 50 MCG/ACT nasal spray Commonly known as: FLONASE   naproxen 500 MG tablet Commonly known as: NAPROSYN     TAKE these medications   atorvastatin 40 MG tablet Commonly known as: Lipitor Take 1 tablet (40 mg total) by mouth daily.   blood glucose meter kit and supplies Dispense  based on patient and insurance preference. Use up to four times daily as directed. (FOR ICD-10 E10.9, E11.9).   fenofibrate 160 MG tablet Take 1 tablet (160 mg total) by mouth daily. Start taking on: Mar 08, 2020   NovoLIN 70/30 FlexPen Relion (70-30) 100 UNIT/ML KwikPen Generic drug: insulin isophane & regular human Inject 20 Units into the skin  2 (two) times daily.   NovoLIN R FlexPen ReliOn 100 UNIT/ML Sopn Generic drug: Insulin Regular Human Inject 0-15 Units as directed in the morning, at noon, and at bedtime.   Pen Needles 3/16" 31G X 5 MM Misc 1 each by Does not apply route in the morning, at noon, and at bedtime.       Allergies  Allergen Reactions  . Ivp Dye [Iodinated Diagnostic Agents]     Consultations:  None   Procedures/Studies: DG Chest Portable 1 View  Result Date: 03/04/2020 CLINICAL DATA:  Not feeling well with dizziness and urinary frequency 1 week. EXAM: PORTABLE CHEST 1 VIEW COMPARISON:  11/24/2019 FINDINGS: Lungs are adequately inflated and otherwise clear. Cardiomediastinal silhouette and remainder of the exam is unchanged. IMPRESSION: No active disease. Electronically Signed   By: Marin Olp M.D.   On: 03/04/2020 13:55       Subjective: Patient seen and examined at the bedside this morning.  Hemodynamically stable for discharge today.  Discharge Exam: Vitals:   03/07/20 0700 03/07/20 0800  BP:    Pulse: 71 71  Resp: 14 14  Temp:  97.6 F (36.4 C)  SpO2: 96% 97%   Vitals:   03/07/20 0438 03/07/20 0500 03/07/20 0700 03/07/20 0800  BP:      Pulse: 73 73 71 71  Resp: '13 13 14 14  ' Temp:    97.6 F (36.4 C)  TempSrc:    Oral  SpO2: 96% 97% 96% 97%  Weight:      Height:        General: Pt is alert, awake, not in acute distress,obese Cardiovascular: RRR, S1/S2 +, no rubs, no gallops Respiratory: CTA bilaterally, no wheezing, no rhonchi Abdominal: Soft, NT, ND, bowel sounds + Extremities: no edema, no cyanosis    The results of significant diagnostics from this hospitalization (including imaging, microbiology, ancillary and laboratory) are listed below for reference.     Microbiology: Recent Results (from the past 240 hour(s))  Respiratory Panel by RT PCR (Flu A&B, Covid) - Nasopharyngeal Swab     Status: Abnormal   Collection Time: 03/04/20  5:15 PM   Specimen:  Nasopharyngeal Swab  Result Value Ref Range Status   SARS Coronavirus 2 by RT PCR POSITIVE (A) NEGATIVE Final    Comment: RESULT CALLED TO, READ BACK BY AND VERIFIED WITH: Carlene Coria RN @ 1497 ON 03/04/2020 BY Punjtan,G (NOTE) SARS-CoV-2 target nucleic acids are DETECTED. SARS-CoV-2 RNA is generally detectable in upper respiratory specimens  during the acute phase of infection. Positive results are indicative of the presence of the identified virus, but do not rule out bacterial infection or co-infection with other pathogens not detected by the test. Clinical correlation with patient history and other diagnostic information is necessary to determine patient infection status. The expected result is Negative. Fact Sheet for Patients:  PinkCheek.be Fact Sheet for Healthcare Providers: GravelBags.it This test is not yet approved or cleared by the [place] FDA and  has been authorized for detection and/or diagnosis of SARS-CoV-2 by FDA under an Emergency Use Authorization (EUA).  This EUA will remain in effect (meaning this  test ca n be used) for the duration of  the COVID-19 declaration under Section 564(b)(1) of the Act, 21 U.S.C. section 360bbb-3(b)(1), unless the authorization is terminated or revoked sooner.    Influenza A by PCR NEGATIVE NEGATIVE Final   Influenza B by PCR NEGATIVE NEGATIVE Final    Comment: (NOTE) The Xpert Xpress SARS-CoV-2/FLU/RSV assay is intended as an aid in  the diagnosis of influenza from Nasopharyngeal swab specimens and  should not be used as a sole basis for treatment. Nasal washings and  aspirates are unacceptable for Xpert Xpress SARS-CoV-2/FLU/RSV  testing. Fact Sheet for Patients: PinkCheek.be Fact Sheet for Healthcare Providers: GravelBags.it This test is not yet approved or cleared by the [place] FDA and  has been  authorized for detection and/or diagnosis of SARS-CoV-2 by  FDA under an Emergency Use Authorization (EUA). This EUA will remain  in effect (meaning this test can be used) for the duration of the  Covid-19 declaration under Section 564(b)(1) of the Act, 21  U.S.C. section 360bbb-3(b)(1), unless the authorization is  terminated or revoked. Performed at [facility], [address] [place], [place] [address]   MRSA PCR Screening     Status: None   Collection Time: 03/04/20  9:37 PM   Specimen: Nasal Mucosa; Nasopharyngeal  Result Value Ref Range Status   MRSA by PCR NEGATIVE NEGATIVE Final    Comment: Performed at [facility], [place] [address] [place], [place] [address]     Labs: BNP (last 3 results) No results for input(s): BNP in the last 8760 hours. Basic Metabolic Panel: Recent Labs  Lab 03/05/20 0641 03/05/20 1540 03/06/20 0136 03/06/20 1156 03/07/20 0211  NA 136 130* 135 135 142  K 3.5 3.5 3.2* 3.5 3.4*  CL 101 100 101 103 108  CO2 22 17* 20* 23 16*  GLUCOSE 172* 401* 219* 308* 253*  BUN '14 13 12 11 9  ' CREATININE 0.95 1.09 0.79 1.03 0.80  CALCIUM 8.9 8.4* 8.7* 8.8* 8.1*   Liver Function Tests: Recent Labs  Lab 03/04/20 1416  AST 42*  ALT 65*  ALKPHOS 102  BILITOT 2.0*  PROT 7.5  ALBUMIN 4.4   Recent Labs  Lab 03/04/20 1416  LIPASE 29   No results for input(s): AMMONIA in the last 168 hours. CBC: Recent Labs  Lab 03/04/20 1321 03/05/20 0641 03/06/20 0136  WBC 5.3 4.4 3.2*  NEUTROABS 3.0  --  1.1*  HGB 15.5 13.7 12.7*  HCT 47.1 44.5 41.7  MCV 73.9* 75.4* 75.8*  PLT 312 253 210   Cardiac Enzymes: No results for input(s): CKTOTAL, CKMB, CKMBINDEX, TROPONINI in the last 168 hours. BNP: Invalid input(s): POCBNP CBG: Recent Labs  Lab 03/06/20 1943 03/06/20 2200 03/07/20 0019 03/07/20 0640 03/07/20 0736  GLUCAP 294* 286* 230* 201* 163*   D-Dimer Recent Labs    03/04/20 2006  DDIMER 0.52*   Hgb  A1c Recent Labs    03/04/20 2242  HGBA1C 11.9*   Lipid Profile Recent Labs    03/04/20 1416 03/04/20 1416 03/05/20 0641 03/05/20 1759 03/06/20 1156 03/07/20 0211  CHOL 471*  --  365*  --   --   --   HDL NOT REPORTED DUE TO HIGH TRIGLYCERIDES  --  NOT REPORTED DUE TO HIGH TRIGLYCERIDES  --   --   --   LDLCALC NOT CALCULATED  --  UNABLE TO CALCULATE IF TRIGLYCERIDE OVER 400 mg/dL  --   --   --  TRIG >5,000*   < > 2,321*   < > 1,212* 1,312*  CHOLHDL NOT REPORTED DUE TO HIGH TRIGLYCERIDES  --  NOT REPORTED DUE TO HIGH TRIGLYCERIDES  --   --   --   LDLDIRECT  --   --  UNABLE TO CALCULATE IF TRIGLYCERIDE IS >1293 mg/dL  --   --   --    < > = values in this interval not displayed.   Thyroid function studies No results for input(s): TSH, T4TOTAL, T3FREE, THYROIDAB in the last 72 hours.  Invalid input(s): FREET3 Anemia work up Recent Labs    03/04/20 2006  FERRITIN 802*   Urinalysis    Component Value Date/Time   COLORURINE STRAW (A) 03/04/2020 1321   APPEARANCEUR CLEAR 03/04/2020 1321   LABSPEC 1.010 03/04/2020 1321   PHURINE 5.5 03/04/2020 1321   GLUCOSEU >=500 (A) 03/04/2020 1321   HGBUR SMALL (A) 03/04/2020 1321   BILIRUBINUR NEGATIVE 03/04/2020 1321   KETONESUR 40 (A) 03/04/2020 1321   PROTEINUR NEGATIVE 03/04/2020 1321   NITRITE NEGATIVE 03/04/2020 1321   LEUKOCYTESUR NEGATIVE 03/04/2020 1321   Sepsis Labs Invalid input(s): PROCALCITONIN,  WBC,  LACTICIDVEN Microbiology Recent Results (from the past 240 hour(s))  Respiratory Panel by RT PCR (Flu A&B, Covid) - Nasopharyngeal Swab     Status: Abnormal   Collection Time: 03/04/20  5:15 PM   Specimen: Nasopharyngeal Swab  Result Value Ref Range Status   SARS Coronavirus 2 by RT PCR POSITIVE (A) NEGATIVE Final    Comment: RESULT CALLED TO, READ BACK BY AND VERIFIED WITH: Carlene Coria RN @ 5916 ON 03/04/2020 BY Punjtan,G (NOTE) SARS-CoV-2 target nucleic acids are DETECTED. SARS-CoV-2 RNA is generally detectable in  upper respiratory specimens  during the acute phase of infection. Positive results are indicative of the presence of the identified virus, but do not rule out bacterial infection or co-infection with other pathogens not detected by the test. Clinical correlation with patient history and other diagnostic information is necessary to determine patient infection status. The expected result is Negative. Fact Sheet for Patients:  PinkCheek.be Fact Sheet for Healthcare Providers: GravelBags.it This test is not yet approved or cleared by the [place] FDA and  has been authorized for detection and/or diagnosis of SARS-CoV-2 by FDA under an Emergency Use Authorization (EUA).  This EUA will remain in effect (meaning this test ca n be used) for the duration of  the COVID-19 declaration under Section 564(b)(1) of the Act, 21 U.S.C. section 360bbb-3(b)(1), unless the authorization is terminated or revoked sooner.    Influenza A by PCR NEGATIVE NEGATIVE Final   Influenza B by PCR NEGATIVE NEGATIVE Final    Comment: (NOTE) The Xpert Xpress SARS-CoV-2/FLU/RSV assay is intended as an aid in  the diagnosis of influenza from Nasopharyngeal swab specimens and  should not be used as a sole basis for treatment. Nasal washings and  aspirates are unacceptable for Xpert Xpress SARS-CoV-2/FLU/RSV  testing. Fact Sheet for Patients: PinkCheek.be Fact Sheet for Healthcare Providers: GravelBags.it This test is not yet approved or cleared by the [place] FDA and  has been authorized for detection and/or diagnosis of SARS-CoV-2 by  FDA under an Emergency Use Authorization (EUA). This EUA will remain  in effect (meaning this test can be used) for the duration of the  Covid-19 declaration under Section 564(b)(1) of the Act, 21  U.S.C. section 360bbb-3(b)(1), unless the authorization is   terminated or revoked. Performed at [facility], [place],  [place], [place] [address]   MRSA PCR Screening     Status: None   Collection Time: 03/04/20  9:37 PM   Specimen: Nasal Mucosa; Nasopharyngeal  Result Value Ref Range Status   MRSA by PCR NEGATIVE NEGATIVE Final    Comment: Performed at [facility], [place] [address], [place], [place] [address]    Please note: You were cared for by a hospitalist during your hospital stay. Once you are discharged, your primary care physician will handle any further medical issues. Please note that NO REFILLS for any discharge medications will be authorized once you are discharged, as it is imperative that you return to your primary care physician (or establish a relationship with a primary care physician if you do not have one) for your post hospital discharge needs so that they can reassess your need for medications and monitor your lab values.    Time coordinating discharge: 40 minutes  SIGNED:   Shelly Coss, MD  [facility] 03/07/2020, 10:36 AM Pager [phone]  If 7PM-7AM, please contact night-coverage [email or web address] Password [email or web address]

## 2020-04-04 ENCOUNTER — Ambulatory Visit (INDEPENDENT_AMBULATORY_CARE_PROVIDER_SITE_OTHER): Payer: Self-pay | Admitting: Internal Medicine

## 2020-04-04 ENCOUNTER — Other Ambulatory Visit: Payer: Self-pay

## 2020-04-04 VITALS — Ht 73.0 in

## 2020-04-04 DIAGNOSIS — Z5329 Procedure and treatment not carried out because of patient's decision for other reasons: Secondary | ICD-10-CM

## 2020-04-04 NOTE — Progress Notes (Signed)
NO SHOW for IV x 2 >> will not reschedule pt.

## 2020-04-09 ENCOUNTER — Encounter: Payer: Self-pay | Admitting: Internal Medicine

## 2020-04-09 ENCOUNTER — Ambulatory Visit: Payer: Self-pay | Admitting: Internal Medicine

## 2020-04-10 ENCOUNTER — Ambulatory Visit: Payer: Self-pay | Attending: Internal Medicine

## 2020-04-10 DIAGNOSIS — Z23 Encounter for immunization: Secondary | ICD-10-CM

## 2020-04-10 NOTE — Progress Notes (Signed)
   Covid-19 Vaccination Clinic  Name:  Louis Vargas    MRN: [id number] DOB: 26-Mar-1981  04/10/2020  [name] was observed post Covid-19 immunization for 30 minutes based on pre-vaccination screening without incident. He was provided with Vaccine Information Sheet and instruction to access the V-Safe system.   [name] was instructed to call 911 with any severe reactions post vaccine: Marland Kitchen Difficulty breathing  . Swelling of face and throat  . A fast heartbeat  . A bad rash all over body  . Dizziness and weakness   Immunizations Administered    Name Date Dose VIS Date Route   Moderna COVID-19 Vaccine 04/10/2020  2:07 PM 0.5 mL 10/2019 Intramuscular   Manufacturer: Moderna   Lot: [id number]   NDC: 59409-050-25

## 2020-05-15 ENCOUNTER — Encounter (HOSPITAL_COMMUNITY): Payer: Self-pay

## 2020-05-15 ENCOUNTER — Emergency Department (HOSPITAL_COMMUNITY): Payer: Self-pay

## 2020-05-15 ENCOUNTER — Inpatient Hospital Stay (HOSPITAL_COMMUNITY)
Admission: EM | Admit: 2020-05-15 | Discharge: 2020-05-17 | DRG: 639 | Disposition: A | Discharge status: Home / Self Care | Payer: Self-pay | Attending: Internal Medicine | Admitting: Internal Medicine

## 2020-05-15 ENCOUNTER — Observation Stay (HOSPITAL_COMMUNITY): Payer: Self-pay

## 2020-05-15 ENCOUNTER — Other Ambulatory Visit: Payer: Self-pay

## 2020-05-15 DIAGNOSIS — Z9114 Patient's other noncompliance with medication regimen: Secondary | ICD-10-CM

## 2020-05-15 DIAGNOSIS — K219 Gastro-esophageal reflux disease without esophagitis: Secondary | ICD-10-CM | POA: Diagnosis present

## 2020-05-15 DIAGNOSIS — E669 Obesity, unspecified: Secondary | ICD-10-CM | POA: Diagnosis present

## 2020-05-15 DIAGNOSIS — Z8616 Personal history of COVID-19: Secondary | ICD-10-CM

## 2020-05-15 DIAGNOSIS — E781 Pure hyperglyceridemia: Secondary | ICD-10-CM | POA: Diagnosis present

## 2020-05-15 DIAGNOSIS — Z6831 Body mass index (BMI) 31.0-31.9, adult: Secondary | ICD-10-CM

## 2020-05-15 DIAGNOSIS — K5900 Constipation, unspecified: Secondary | ICD-10-CM | POA: Diagnosis present

## 2020-05-15 DIAGNOSIS — E1110 Type 2 diabetes mellitus with ketoacidosis without coma: Principal | ICD-10-CM | POA: Diagnosis present

## 2020-05-15 DIAGNOSIS — Z794 Long term (current) use of insulin: Secondary | ICD-10-CM

## 2020-05-15 DIAGNOSIS — E876 Hypokalemia: Secondary | ICD-10-CM | POA: Diagnosis present

## 2020-05-15 HISTORY — DX: Type 2 diabetes mellitus without complications: E11.9

## 2020-05-15 LAB — URINALYSIS, ROUTINE W REFLEX MICROSCOPIC
Bacteria, UA: NONE SEEN
Bilirubin Urine: NEGATIVE
Glucose, UA: >=500 mg/dL — AB
Ketones, ur: 80 mg/dL — AB
Leukocytes,Ua: NEGATIVE
Nitrite: NEGATIVE
Protein, ur: 100 mg/dL — AB
Specific Gravity, Urine: 1.03 (ref 1.005–1.030)
pH: 5 (ref 5.0–8.0)

## 2020-05-15 LAB — BASIC METABOLIC PANEL
Anion gap: 16 — ABNORMAL HIGH (ref 5–15)
BUN: 11 mg/dL (ref 6–20)
CO2: 13 mmol/L — ABNORMAL LOW (ref 22–32)
Calcium: 8.2 mg/dL — ABNORMAL LOW (ref 8.9–10.3)
Chloride: 105 mmol/L (ref 98–111)
Creatinine, Ser: 0.94 mg/dL (ref 0.61–1.24)
GFR calc Af Amer: >60 mL/min (ref >60)
GFR calc non Af Amer: >60 mL/min (ref >60)
Glucose, Bld: 228 mg/dL — ABNORMAL HIGH (ref 70–99)
Potassium: 5 mmol/L (ref 3.5–5.1)
Sodium: 134 mmol/L — ABNORMAL LOW (ref 135–145)

## 2020-05-15 LAB — BLOOD GAS, VENOUS
Acid-base deficit: 14.5 mmol/L — ABNORMAL HIGH (ref 0.0–2.0)
Bicarbonate: 13.7 mmol/L — ABNORMAL LOW (ref 20.0–28.0)
O2 Saturation: 54.1 %
Patient temperature: 98.6
pCO2, Ven: 40.3 mmHg — ABNORMAL LOW (ref 44.0–60.0)
pH, Ven: 7.158 — CL (ref 7.250–7.430)
pO2, Ven: 34.7 mmHg (ref 32.0–45.0)

## 2020-05-15 LAB — CBG MONITORING, ED
Glucose-Capillary: 342 mg/dL — ABNORMAL HIGH (ref 70–99)
Glucose-Capillary: 243 mg/dL — ABNORMAL HIGH (ref 70–99)
Glucose-Capillary: 190 mg/dL — ABNORMAL HIGH (ref 70–99)
Glucose-Capillary: 169 mg/dL — ABNORMAL HIGH (ref 70–99)
Glucose-Capillary: 130 mg/dL — ABNORMAL HIGH (ref 70–99)

## 2020-05-15 LAB — SARS CORONAVIRUS 2 BY RT PCR (HOSPITAL ORDER, PERFORMED IN ~~LOC~~ HOSPITAL LAB): SARS Coronavirus 2: NEGATIVE

## 2020-05-15 LAB — HEMOGLOBIN A1C
Hgb A1c MFr Bld: 16 % — ABNORMAL HIGH (ref 4.8–5.6)
Mean Plasma Glucose: 412.5 mg/dL

## 2020-05-15 LAB — COMPREHENSIVE METABOLIC PANEL
ALT: 19 U/L (ref 0–44)
AST: 16 U/L (ref 15–41)
Albumin: 4.1 g/dL (ref 3.5–5.0)
Alkaline Phosphatase: 90 U/L (ref 38–126)
Anion gap: 23 — ABNORMAL HIGH (ref 5–15)
BUN: 12 mg/dL (ref 6–20)
CO2: 11 mmol/L — ABNORMAL LOW (ref 22–32)
Calcium: 9.5 mg/dL (ref 8.9–10.3)
Chloride: 97 mmol/L — ABNORMAL LOW (ref 98–111)
Creatinine, Ser: 1.03 mg/dL (ref 0.61–1.24)
GFR calc Af Amer: >60 mL/min (ref >60)
GFR calc non Af Amer: >60 mL/min (ref >60)
Glucose, Bld: 387 mg/dL — ABNORMAL HIGH (ref 70–99)
Potassium: 4.1 mmol/L (ref 3.5–5.1)
Sodium: 131 mmol/L — ABNORMAL LOW (ref 135–145)
Total Bilirubin: 1.8 mg/dL — ABNORMAL HIGH (ref 0.3–1.2)
Total Protein: 7.4 g/dL (ref 6.5–8.1)

## 2020-05-15 LAB — LIPID PANEL
Cholesterol: 336 mg/dL — ABNORMAL HIGH (ref 0–200)
HDL: 20 mg/dL — ABNORMAL LOW (ref >40)
LDL Cholesterol: UNDETERMINED mg/dL (ref 0–99)
Triglycerides: 1293 mg/dL — ABNORMAL HIGH (ref <150)
VLDL: UNDETERMINED mg/dL (ref 0–40)

## 2020-05-15 LAB — LDL CHOLESTEROL, DIRECT: Direct LDL: UNDETERMINED mg/dL (ref 0–99)

## 2020-05-15 LAB — CBC
HCT: 45.2 % (ref 39.0–52.0)
Hemoglobin: 13.6 g/dL (ref 13.0–17.0)
MCH: 23.3 pg — ABNORMAL LOW (ref 26.0–34.0)
MCHC: 30.1 g/dL (ref 30.0–36.0)
MCV: 77.5 fL — ABNORMAL LOW (ref 80.0–100.0)
Platelets: 280 10*3/uL (ref 150–400)
RBC: 5.83 MIL/uL — ABNORMAL HIGH (ref 4.22–5.81)
RDW: 14.8 % (ref 11.5–15.5)
WBC: 4.2 10*3/uL (ref 4.0–10.5)
nRBC: 0 % (ref 0.0–0.2)

## 2020-05-15 LAB — LIPASE, BLOOD: Lipase: 26 U/L (ref 11–51)

## 2020-05-15 LAB — TROPONIN I (HIGH SENSITIVITY): Troponin I (High Sensitivity): 5 ng/L (ref <18)

## 2020-05-15 MED ORDER — POTASSIUM CHLORIDE 10 MEQ/100ML IV SOLN
10.0000 meq | INTRAVENOUS | Status: AC
  Administered 2020-05-15 (×2): 10 meq via INTRAVENOUS
  Filled 2020-05-15 (×2): qty 100

## 2020-05-15 MED ORDER — MORPHINE SULFATE (PF) 4 MG/ML IV SOLN
4.0000 mg | Freq: Once | INTRAVENOUS | Status: AC
  Administered 2020-05-15: 4 mg via INTRAVENOUS
  Filled 2020-05-15: qty 1

## 2020-05-15 MED ORDER — INSULIN REGULAR(HUMAN) IN NACL 100-0.9 UT/100ML-% IV SOLN
INTRAVENOUS | Status: DC
  Administered 2020-05-15: 19 [IU]/h via INTRAVENOUS
  Filled 2020-05-15: qty 100

## 2020-05-15 MED ORDER — DEXTROSE 50 % IV SOLN: 0.0000 mL | INTRAVENOUS | Status: DC | PRN

## 2020-05-15 MED ORDER — ONDANSETRON HCL 4 MG/2ML IJ SOLN
4.0000 mg | Freq: Once | INTRAMUSCULAR | Status: AC
  Administered 2020-05-15: 4 mg via INTRAVENOUS
  Filled 2020-05-15: qty 2

## 2020-05-15 MED ORDER — SENNOSIDES-DOCUSATE SODIUM 8.6-50 MG PO TABS
2.0000 | ORAL_TABLET | Freq: Two times a day (BID) | ORAL | Status: DC
  Administered 2020-05-16 (×2): 2 via ORAL
  Filled 2020-05-15 (×2): qty 2

## 2020-05-15 MED ORDER — SODIUM CHLORIDE 0.9 % IV BOLUS
1000.0000 mL | INTRAVENOUS | Status: DC
  Administered 2020-05-15: 1000 mL via INTRAVENOUS

## 2020-05-15 MED ORDER — DEXTROSE-NACL 5-0.45 % IV SOLN: INTRAVENOUS | Status: DC

## 2020-05-15 MED ORDER — SODIUM CHLORIDE 0.9 % IV SOLN: INTRAVENOUS | Status: DC

## 2020-05-15 MED ORDER — POLYETHYLENE GLYCOL 3350 17 G PO PACK
17.0000 g | PACK | Freq: Every day | ORAL | Status: DC
  Administered 2020-05-16: 17 g via ORAL
  Filled 2020-05-15: qty 1

## 2020-05-15 MED ORDER — SODIUM CHLORIDE 0.9% FLUSH
3.0000 mL | Freq: Once | INTRAVENOUS | Status: AC
  Administered 2020-05-15: 3 mL via INTRAVENOUS

## 2020-05-15 NOTE — ED Triage Notes (Signed)
Patient c/o no BM in 2 weeks. Patient states he has had N/V and abdominal pain since yesterday.

## 2020-05-15 NOTE — ED Provider Notes (Signed)
[facility] DEPT Provider Note   CSN: [id number] Arrival date & time: 05/15/20  1115     History Chief Complaint  Patient presents with  . Emesis  . Abdominal Pain  . Constipation    Adelfo Diebel is a 39 y.o. male.  HPI   39 year old male with history of diabetes, obesity, who presents to the emergency department today for evaluation of abdominal pain nausea and vomiting.  States he started feeling generally poor yesterday and was fatigued and generally weak.  Started having nausea and vomiting that has persisted since then.  Has diffuse abdominal pain as well that has been constant.  Denies any diarrhea.  Reports constipation.  Is still passing flatus.  Denies history of abdominal surgeries.  Denies fevers.  Has had some cough and shortness of breath.  Of note, prior records reviewed.  Patient recently diagnosed with diabetes in May and just started on insulin.  States he ran out of it 1 week ago.  Past Medical History:  Diagnosis Date  . Diabetes mellitus without complication ([place])   . Obesity (BMI 30.0-34.9)     Patient Active Problem List   Diagnosis Date Noted  . DKA (diabetic ketoacidoses) ([place]) 03/04/2020  . Obesity (BMI 30-39.9) 03/04/2020  . Acute kidney injury ([place]) 03/04/2020  . Hyperkalemia 03/04/2020  . Lab test positive for detection of COVID-19 virus 03/04/2020    History reviewed. No pertinent surgical history.     Family History  Problem Relation Age of Onset  . Diabetes Mother   . Diabetes Sister   . Diabetes Brother     Social History   Tobacco Use  . Smoking status: Never Smoker  . Smokeless tobacco: Never Used  Vaping Use  . Vaping Use: Never used  Substance Use Topics  . Alcohol use: Yes    Comment: occ  . Drug use: No    Home Medications Prior to Admission medications   Medication Sig Start Date End Date Taking? Authorizing Provider  atorvastatin (LIPITOR) 40 MG tablet Take 1 tablet (40 mg total) by  mouth daily. 03/07/20 05/06/20  Shelly Coss, MD  blood glucose meter kit and supplies Dispense based on patient and insurance preference. Use up to four times daily as directed. (FOR ICD-10 E10.9, E11.9). 03/07/20   Shelly Coss, MD  fenofibrate 160 MG tablet Take 1 tablet (160 mg total) by mouth daily. 03/08/20   Shelly Coss, MD  insulin isophane & regular human (NOVOLIN 70/30 FLEXPEN RELION) (70-30) 100 UNIT/ML KwikPen Inject 20 Units into the skin 2 (two) times daily. 03/07/20   Shelly Coss, MD  Insulin Pen Needle (PEN NEEDLES 3/16") 31G X 5 MM MISC 1 each by Does not apply route in the morning, at noon, and at bedtime. 03/07/20   Shelly Coss, MD  Insulin Regular Human (NOVOLIN R FLEXPEN RELION) 100 UNIT/ML SOPN Inject 0-15 Units as directed in the morning, at noon, and at bedtime. 03/07/20   Shelly Coss, MD    Allergies    Ivp dye [iodinated diagnostic agents]  Review of Systems   Review of Systems  Constitutional: Negative for chills and fever.  HENT: Negative for ear pain and sore throat.   Eyes: Negative for pain and visual disturbance.  Respiratory: Positive for cough and shortness of breath.   Cardiovascular: Negative for chest pain.  Gastrointestinal: Positive for abdominal pain, constipation, nausea and vomiting. Negative for diarrhea.  Genitourinary: Negative for dysuria and hematuria.  Musculoskeletal: Negative for back pain.  Skin:  Negative for rash.  Neurological: Negative for headaches.  All other systems reviewed and are negative.   Physical Exam Updated Vital Signs BP 129/74 (BP Location: Right Arm)   Pulse 84   Temp 98.1 F (36.7 C) (Oral)   Resp 18   Ht 6' (1.829 m)   Wt 103.9 kg   SpO2 99%   BMI 31.06 kg/m   Physical Exam Vitals and nursing note reviewed.  Constitutional:      Appearance: He is well-developed. He is ill-appearing.  HENT:     Head: Normocephalic and atraumatic.     Mouth/Throat:     Mouth: Mucous membranes are dry.  Eyes:      Conjunctiva/sclera: Conjunctivae normal.  Cardiovascular:     Rate and Rhythm: Normal rate and regular rhythm.     Heart sounds: Normal heart sounds. No murmur heard.   Pulmonary:     Effort: Pulmonary effort is normal. No respiratory distress.     Breath sounds: Normal breath sounds. No wheezing, rhonchi or rales.  Abdominal:     General: Bowel sounds are normal.     Palpations: Abdomen is soft.     Tenderness: There is generalized abdominal tenderness. There is no guarding or rebound.  Musculoskeletal:     Cervical back: Neck supple.  Skin:    General: Skin is warm and dry.  Neurological:     Mental Status: He is alert.     ED Results / Procedures / Treatments   Labs (all labs ordered are listed, but only abnormal results are displayed) Labs Reviewed  COMPREHENSIVE METABOLIC PANEL - Abnormal; Notable for the following components:      Result Value   Sodium 131 (*)    Chloride 97 (*)    CO2 11 (*)    Glucose, Bld 387 (*)    Total Bilirubin 1.8 (*)    Anion gap 23 (*)    All other components within normal limits  CBC - Abnormal; Notable for the following components:   RBC 5.83 (*)    MCV 77.5 (*)    MCH 23.3 (*)    All other components within normal limits  URINALYSIS, ROUTINE W REFLEX MICROSCOPIC - Abnormal; Notable for the following components:   Color, Urine STRAW (*)    Glucose, UA >=500 (*)    Hgb urine dipstick SMALL (*)    Ketones, ur 80 (*)    Protein, ur 100 (*)    All other components within normal limits  BLOOD GAS, VENOUS - Abnormal; Notable for the following components:   pH, Ven 7.158 (*)    pCO2, Ven 40.3 (*)    Bicarbonate 13.7 (*)    Acid-base deficit 14.5 (*)    All other components within normal limits  CBG MONITORING, ED - Abnormal; Notable for the following components:   Glucose-Capillary 342 (*)    All other components within normal limits  SARS CORONAVIRUS 2 BY RT PCR (HOSPITAL ORDER, [facility])  LIPASE,  BLOOD  LIPID PANEL  HEMOGLOBIN A1C  CBG MONITORING, ED    EKG EKG Interpretation  Date/Time:  Tuesday May 15 2020 15:25:55 EDT Ventricular Rate:  74 PR Interval:    QRS Duration: 101 QT Interval:  390 QTC Calculation: 433 R Axis:   103 Text Interpretation: Sinus rhythm Prominent P waves, nondiagnostic Consider left ventricular hypertrophy ST elevation, consider lateral injury No acute changes No significant change since last tracing Confirmed by Varney Biles [phone]) on 05/15/2020 3:39:17  PM   Radiology DG Chest Portable 1 View  Result Date: 05/15/2020 CLINICAL DATA:  Cough, nausea and vomiting, abdominal pain EXAM: PORTABLE CHEST 1 VIEW COMPARISON:  03/04/2020 FINDINGS: The heart size and mediastinal contours are within normal limits. Both lungs are clear. The visualized skeletal structures are unremarkable. IMPRESSION: No active disease. Electronically Signed   By: Randa Ngo M.D.   On: 05/15/2020 16:16    Procedures Procedures (including critical care time)  CRITICAL CARE Performed by: Rodney Booze   Total critical care time: 35 minutes  Critical care time was exclusive of separately billable procedures and treating other patients.  Critical care was necessary to treat or prevent imminent or life-threatening deterioration.  Critical care was time spent personally by me on the following activities: development of treatment plan with patient and/or surrogate as well as nursing, discussions with consultants, evaluation of patient's response to treatment, examination of patient, obtaining history from patient or surrogate, ordering and performing treatments and interventions, ordering and review of laboratory studies, ordering and review of radiographic studies, pulse oximetry and re-evaluation of patient's condition.   Medications Ordered in ED Medications  insulin regular, human (MYXREDLIN) 100 units/ 100 mL infusion (19 Units/hr Intravenous New Bag/Given 05/15/20  1602)  0.9 %  sodium chloride infusion ( Intravenous New Bag/Given 05/15/20 1624)  dextrose 5 %-0.45 % sodium chloride infusion (0 mLs Intravenous Hold 05/15/20 1551)  dextrose 50 % solution 0-50 mL (has no administration in time range)  sodium chloride 0.9 % bolus 1,000 mL (0 mLs Intravenous Stopped 05/15/20 1623)  potassium chloride 10 mEq in 100 mL IVPB (10 mEq Intravenous New Bag/Given 05/15/20 1552)  ondansetron (ZOFRAN) injection 4 mg (has no administration in time range)  morphine 4 MG/ML injection 4 mg (has no administration in time range)  senna-docusate (Senokot-S) tablet 2 tablet (has no administration in time range)  polyethylene glycol (MIRALAX / GLYCOLAX) packet 17 g (has no administration in time range)  sodium chloride flush (NS) 0.9 % injection 3 mL (3 mLs Intravenous Given 05/15/20 1440)    ED Course  I have reviewed the triage vital signs and the nursing notes.  Pertinent labs & imaging results that were available during my care of the patient were reviewed by me and considered in my medical decision making (see chart for details).    MDM Rules/Calculators/A&P                          39 year old male presenting for evaluation of abdominal pain nausea and vomiting that started last night.  Has been out of his insulin for 1 week.  Also has had a cough for the last 2 days.  No fevers.  Reviewed/interpreted labs CBC is without leukocytosis or anemia CMP with low bicarb, elevated blood glucose, elevated anion gap at 23.  Normal creatinine and liver enzymes.  Slightly elevated bilirubin. Lipase negative UA with glucosuria, hematuria, ketonuria and proteinuria. No nitrites, leukocytes or bacteria. VBG with pH 7.1, bicarb low COVID pending on admission  EKG with NSR, unchanged from prior  CXR reviewed/interpreted - no pna, ptx or other acute abnormality  Patient given IV potassium, IV fluids, started on insulin drip. Will admit to hospitalist service for further  management.  4:32 PM CONSULT with Dr. Renne Crigler who accepts patient for admission.   Final Clinical Impression(s) / ED Diagnoses Final diagnoses:  Diabetic ketoacidosis without coma associated with type 2 diabetes mellitus ([place])    Rx /  DC Orders ED Discharge Orders    None       Bishop Dublin 05/15/20 [place], Ankit, MD 05/20/20 1251

## 2020-05-15 NOTE — H&P (Signed)
History and Physical    Louis Vargas:[id number] DOB: 1981-06-24 DOA: 05/15/2020  I have briefly reviewed the Louis Vargas's prior medical records in [facility]  PCP: Louis Vargas, No Pcp Per  Louis Vargas coming from: home  Chief Complaint: abdominal pain, nausea and vomiting  HPI: Louis Vargas is a 39 y.o. male with medical history significant of recently diagnosed diabetes mellitus in May 2021, hypertriglyceridemia, prior Covid in January 2021 and tested positive again in May 2021, presents to the hospital with complaints of abdominal pain, nausea and vomiting.  Louis Vargas tells me that he has run out of his insulin about a week ago and has not taken any insulin since.  Reports that he has been also very constipated and has not had a bowel movement in a week.  He was supposed to follow with endocrinology, Dr. Philemon Kingdom, but he was a no-show x2.  He denies any chest pain currently, no shortness of breath.  He denies any fever or chills.  Denies any sore throat.  He tells me he has been continued to lose weight, he lost weight before being diagnosed with diabetes and is ongoing.  He is not a smoker or drinking  ED Course: In the emergency room he is afebrile, normotensive, satting well on room air.  He is not tachycardic nor tachypneic.  A VBG showed a pH of 7.15.  Labs have low bicarb of 11 with anion gap of 23 and a glucose of 387.  CBC is unremarkable.  Review of Systems: All systems reviewed, and apart from HPI, all negative  Past Medical History:  Diagnosis Date  . Diabetes mellitus without complication ([place])   . Obesity (BMI 30.0-34.9)     History reviewed. No pertinent surgical history.   reports that he has never smoked. He has never used smokeless tobacco. He reports current alcohol use. He reports that he does not use drugs.  Allergies  Allergen Reactions  . Ivp Dye [Iodinated Diagnostic Agents]     Family History  Problem Relation Age of Onset  . Diabetes Mother   .  Diabetes Sister   . Diabetes Brother     Prior to Admission medications   Medication Sig Start Date End Date Taking? Authorizing Provider  atorvastatin (LIPITOR) 40 MG tablet Take 1 tablet (40 mg total) by mouth daily. 03/07/20 05/06/20  Shelly Coss, MD  blood glucose meter kit and supplies Dispense based on Louis Vargas and insurance preference. Use up to four times daily as directed. (FOR ICD-10 E10.9, E11.9). 03/07/20   Shelly Coss, MD  fenofibrate 160 MG tablet Take 1 tablet (160 mg total) by mouth daily. 03/08/20   Shelly Coss, MD  insulin isophane & regular human (NOVOLIN 70/30 FLEXPEN RELION) (70-30) 100 UNIT/ML KwikPen Inject 20 Units into the skin 2 (two) times daily. 03/07/20   Shelly Coss, MD  Insulin Pen Needle (PEN NEEDLES 3/16") 31G X 5 MM MISC 1 each by Does not apply route in the morning, at noon, and at bedtime. 03/07/20   Shelly Coss, MD  Insulin Regular Human (NOVOLIN R FLEXPEN RELION) 100 UNIT/ML SOPN Inject 0-15 Units as directed in the morning, at noon, and at bedtime. 03/07/20   Shelly Coss, MD    Physical Exam: Vitals:   05/15/20 1126 05/15/20 1129 05/15/20 1417 05/15/20 1602  BP: 125/79  133/75 129/74  Pulse: 86  80 84  Resp: _0 Temp: 98.2 F (36.8 C)  98.1 F (36.7 C)   TempSrc: Oral  Oral   SpO2: 95%  98% 99%  Weight:  103.9 kg    Height:  6' (1.829 m)      Constitutional: NAD, calm, comfortable Eyes: PERRL, lids and conjunctivae normal ENMT: Mucous membranes are dry Neck: normal, supple Respiratory: clear to auscultation bilaterally, no wheezing, no crackles. Normal respiratory effort. No accessory muscle use.  Cardiovascular: Regular rate and rhythm, no murmurs / rubs / gallops. No extremity edema. 2+ pedal pulses.  Abdomen: Mildly distended, no tenderness, no masses palpated. Bowel sounds positive.  Musculoskeletal: no clubbing / cyanosis. Normal muscle tone.  Skin: no rashes, lesions, ulcers. No induration Neurologic: CN 2-12 grossly  intact. Strength 5/5 in all 4.  Psychiatric: Normal judgment and insight. Alert and oriented x 3. Normal mood.   Labs on Admission: I have personally reviewed following labs and imaging studies  CBC: Recent Labs  Lab 05/15/20 1225  WBC 4.2  HGB 13.6  HCT 45.2  MCV 77.5*  PLT 481   Basic Metabolic Panel: Recent Labs  Lab 05/15/20 1225  NA 131*  K 4.1  CL 97*  CO2 11*  GLUCOSE 387*  BUN 12  CREATININE 1.03  CALCIUM 9.5   Liver Function Tests: Recent Labs  Lab 05/15/20 1225  AST 16  ALT 19  ALKPHOS 90  BILITOT 1.8*  PROT 7.4  ALBUMIN 4.1   Coagulation Profile: No results for input(s): INR, PROTIME in the last 168 hours. BNP (last 3 results) No results for input(s): PROBNP in the last 8760 hours. CBG: Recent Labs  Lab 05/15/20 1523  GLUCAP 342*   Thyroid Function Tests: No results for input(s): TSH, T4TOTAL, FREET4, T3FREE, THYROIDAB in the last 72 hours. Urine analysis:    Component Value Date/Time   COLORURINE STRAW (A) 05/15/2020 1426   APPEARANCEUR CLEAR 05/15/2020 1426   LABSPEC 1.030 05/15/2020 1426   PHURINE 5.0 05/15/2020 1426   GLUCOSEU >=500 (A) 05/15/2020 1426   HGBUR SMALL (A) 05/15/2020 1426   BILIRUBINUR NEGATIVE 05/15/2020 1426   KETONESUR 80 (A) 05/15/2020 1426   PROTEINUR 100 (A) 05/15/2020 1426   NITRITE NEGATIVE 05/15/2020 1426   LEUKOCYTESUR NEGATIVE 05/15/2020 1426     Radiological Exams on Admission: DG Chest Portable 1 View  Result Date: 05/15/2020 CLINICAL DATA:  Cough, nausea and vomiting, abdominal pain EXAM: PORTABLE CHEST 1 VIEW COMPARISON:  03/04/2020 FINDINGS: The heart size and mediastinal contours are within normal limits. Both lungs are clear. The visualized skeletal structures are unremarkable. IMPRESSION: No active disease. Electronically Signed   By: Randa Ngo M.D.   On: 05/15/2020 16:16    EKG: Independently reviewed.  Sinus rhythm  Assessment/Plan  Principal Problem DKA in the setting of insulin  dependent diabetes mellitus, presumed type II, uncontrolled, with hyperglycemia-I do suspect a degree of noncompliance, he missed a follow-up appointment and ran out of insulin and does not appear that he sought care up until he got sick.  Louis Vargas to be admitted to stepdown, IV insulin, fluids, n.p.o. except noncaloric liquids.  Monitor BMPs as per protocol.  Obtain an A1c  Active Problems Hypertriglyceridemia-his triglycerides were in the 1000 range when he came into the hospital last time.  We will recheck a level.  Constipation-we will initiate bowel regimen.  Also obtain an abdominal x-ray to rule out ileus/SBO  Obesity-based on BMI of 31  Pseudohyponatremia-noted  Isolated elevated bilirubin-unclear significance, will monitor   Covid test was positive in January and again in May 2021.  Current Covid is pending  DVT  prophylaxis: Lovenox Code Status: Full code Family Communication: No family at bedside Disposition Plan: Home when ready Bed Type: Stepdown Consults called: None Obs/Inp: Observation   Marzetta Board, MD, PhD [facility]  Contact via [email or web address]  05/15/2020, 4:50 PM

## 2020-05-16 LAB — BASIC METABOLIC PANEL
Anion gap: 14 (ref 5–15)
Anion gap: 15 (ref 5–15)
Anion gap: 13 (ref 5–15)
Anion gap: 12 (ref 5–15)
BUN: 9 mg/dL (ref 6–20)
BUN: 10 mg/dL (ref 6–20)
BUN: 8 mg/dL (ref 6–20)
BUN: 10 mg/dL (ref 6–20)
CO2: 13 mmol/L — ABNORMAL LOW (ref 22–32)
CO2: 15 mmol/L — ABNORMAL LOW (ref 22–32)
CO2: 16 mmol/L — ABNORMAL LOW (ref 22–32)
CO2: 17 mmol/L — ABNORMAL LOW (ref 22–32)
Calcium: 7.8 mg/dL — ABNORMAL LOW (ref 8.9–10.3)
Calcium: 8.6 mg/dL — ABNORMAL LOW (ref 8.9–10.3)
Calcium: 8.6 mg/dL — ABNORMAL LOW (ref 8.9–10.3)
Calcium: 8.6 mg/dL — ABNORMAL LOW (ref 8.9–10.3)
Chloride: 105 mmol/L (ref 98–111)
Chloride: 102 mmol/L (ref 98–111)
Chloride: 105 mmol/L (ref 98–111)
Chloride: 101 mmol/L (ref 98–111)
Creatinine, Ser: 0.77 mg/dL (ref 0.61–1.24)
Creatinine, Ser: 0.85 mg/dL (ref 0.61–1.24)
Creatinine, Ser: 0.71 mg/dL (ref 0.61–1.24)
Creatinine, Ser: 0.89 mg/dL (ref 0.61–1.24)
GFR calc Af Amer: >60 mL/min (ref >60)
GFR calc Af Amer: >60 mL/min (ref >60)
GFR calc Af Amer: >60 mL/min (ref >60)
GFR calc Af Amer: >60 mL/min (ref >60)
GFR calc non Af Amer: >60 mL/min (ref >60)
GFR calc non Af Amer: >60 mL/min (ref >60)
GFR calc non Af Amer: >60 mL/min (ref >60)
GFR calc non Af Amer: >60 mL/min (ref >60)
Glucose, Bld: 256 mg/dL — ABNORMAL HIGH (ref 70–99)
Glucose, Bld: 231 mg/dL — ABNORMAL HIGH (ref 70–99)
Glucose, Bld: 107 mg/dL — ABNORMAL HIGH (ref 70–99)
Glucose, Bld: 334 mg/dL — ABNORMAL HIGH (ref 70–99)
Potassium: 3.3 mmol/L — ABNORMAL LOW (ref 3.5–5.1)
Potassium: 3 mmol/L — ABNORMAL LOW (ref 3.5–5.1)
Potassium: 3.5 mmol/L (ref 3.5–5.1)
Potassium: 3.6 mmol/L (ref 3.5–5.1)
Sodium: 132 mmol/L — ABNORMAL LOW (ref 135–145)
Sodium: 132 mmol/L — ABNORMAL LOW (ref 135–145)
Sodium: 134 mmol/L — ABNORMAL LOW (ref 135–145)
Sodium: 130 mmol/L — ABNORMAL LOW (ref 135–145)

## 2020-05-16 LAB — GLUCOSE, CAPILLARY
Glucose-Capillary: 236 mg/dL — ABNORMAL HIGH (ref 70–99)
Glucose-Capillary: 320 mg/dL — ABNORMAL HIGH (ref 70–99)
Glucose-Capillary: 349 mg/dL — ABNORMAL HIGH (ref 70–99)
Glucose-Capillary: 356 mg/dL — ABNORMAL HIGH (ref 70–99)

## 2020-05-16 LAB — BETA-HYDROXYBUTYRIC ACID
Beta-Hydroxybutyric Acid: 6.45 mmol/L — ABNORMAL HIGH (ref 0.05–0.27)
Beta-Hydroxybutyric Acid: 2.61 mmol/L — ABNORMAL HIGH (ref 0.05–0.27)
Beta-Hydroxybutyric Acid: 2.47 mmol/L — ABNORMAL HIGH (ref 0.05–0.27)

## 2020-05-16 LAB — CBG MONITORING, ED
Glucose-Capillary: 107 mg/dL — ABNORMAL HIGH (ref 70–99)
Glucose-Capillary: 141 mg/dL — ABNORMAL HIGH (ref 70–99)
Glucose-Capillary: 160 mg/dL — ABNORMAL HIGH (ref 70–99)
Glucose-Capillary: 166 mg/dL — ABNORMAL HIGH (ref 70–99)
Glucose-Capillary: 177 mg/dL — ABNORMAL HIGH (ref 70–99)
Glucose-Capillary: 207 mg/dL — ABNORMAL HIGH (ref 70–99)
Glucose-Capillary: 238 mg/dL — ABNORMAL HIGH (ref 70–99)
Glucose-Capillary: 239 mg/dL — ABNORMAL HIGH (ref 70–99)
Glucose-Capillary: 241 mg/dL — ABNORMAL HIGH (ref 70–99)
Glucose-Capillary: 292 mg/dL — ABNORMAL HIGH (ref 70–99)
Glucose-Capillary: 95 mg/dL (ref 70–99)

## 2020-05-16 LAB — HEMOGLOBIN A1C
Hgb A1c MFr Bld: 15.8 % — ABNORMAL HIGH (ref 4.8–5.6)
Mean Plasma Glucose: 406.76 mg/dL

## 2020-05-16 MED ORDER — POTASSIUM CHLORIDE 10 MEQ/100ML IV SOLN: 10.0000 meq | INTRAVENOUS | Status: DC

## 2020-05-16 MED ORDER — INSULIN ASPART 100 UNIT/ML ~~LOC~~ SOLN
0.0000 [IU] | Freq: Three times a day (TID) | SUBCUTANEOUS | Status: DC
  Administered 2020-05-16: 3 [IU] via SUBCUTANEOUS
  Administered 2020-05-16: 11 [IU] via SUBCUTANEOUS
  Filled 2020-05-16: qty 0.15

## 2020-05-16 MED ORDER — ENOXAPARIN SODIUM 40 MG/0.4ML ~~LOC~~ SOLN
40.0000 mg | SUBCUTANEOUS | Status: DC
  Administered 2020-05-16 – 2020-05-17 (×2): 40 mg via SUBCUTANEOUS
  Filled 2020-05-16 (×2): qty 0.4

## 2020-05-16 MED ORDER — INSULIN ISOPHANE & REGULAR (HUMAN 70-30)100 UNIT/ML KWIKPEN: 20.0000 [IU] | PEN_INJECTOR | Freq: Two times a day (BID) | SUBCUTANEOUS | Status: DC

## 2020-05-16 MED ORDER — SODIUM CHLORIDE 0.9 % IV SOLN: INTRAVENOUS | Status: DC

## 2020-05-16 MED ORDER — ATORVASTATIN CALCIUM 40 MG PO TABS
40.0000 mg | ORAL_TABLET | Freq: Every day | ORAL | Status: DC
  Administered 2020-05-16 – 2020-05-17 (×2): 40 mg via ORAL
  Filled 2020-05-16 (×2): qty 1

## 2020-05-16 MED ORDER — POTASSIUM CHLORIDE CRYS ER 20 MEQ PO TBCR
40.0000 meq | EXTENDED_RELEASE_TABLET | Freq: Once | ORAL | Status: AC
  Administered 2020-05-16: 40 meq via ORAL
  Filled 2020-05-16: qty 2

## 2020-05-16 MED ORDER — INSULIN ASPART 100 UNIT/ML ~~LOC~~ SOLN
0.0000 [IU] | SUBCUTANEOUS | Status: DC
  Administered 2020-05-16: 11 [IU] via SUBCUTANEOUS
  Administered 2020-05-16: 5 [IU] via SUBCUTANEOUS
  Administered 2020-05-17: 3 [IU] via SUBCUTANEOUS
  Administered 2020-05-17: 8 [IU] via SUBCUTANEOUS
  Administered 2020-05-17: 3 [IU] via SUBCUTANEOUS

## 2020-05-16 MED ORDER — INSULIN REGULAR(HUMAN) IN NACL 100-0.9 UT/100ML-% IV SOLN
INTRAVENOUS | Status: DC
  Administered 2020-05-16: 18 [IU]/h via INTRAVENOUS
  Filled 2020-05-16: qty 100

## 2020-05-16 MED ORDER — POTASSIUM CHLORIDE 10 MEQ/100ML IV SOLN
10.0000 meq | INTRAVENOUS | Status: AC
  Administered 2020-05-16 (×2): 10 meq via INTRAVENOUS
  Filled 2020-05-16 (×2): qty 100

## 2020-05-16 MED ORDER — DEXTROSE 50 % IV SOLN: 0.0000 mL | INTRAVENOUS | Status: DC | PRN

## 2020-05-16 MED ORDER — INSULIN ASPART PROT & ASPART (70-30 MIX) 100 UNIT/ML ~~LOC~~ SUSP
20.0000 [IU] | Freq: Two times a day (BID) | SUBCUTANEOUS | Status: DC
  Administered 2020-05-16 (×2): 20 [IU] via SUBCUTANEOUS
  Filled 2020-05-16: qty 10

## 2020-05-16 MED ORDER — FENOFIBRATE 160 MG PO TABS
160.0000 mg | ORAL_TABLET | Freq: Every day | ORAL | Status: DC
  Administered 2020-05-16 – 2020-05-17 (×2): 160 mg via ORAL
  Filled 2020-05-16 (×2): qty 1

## 2020-05-16 MED ORDER — DEXTROSE-NACL 5-0.45 % IV SOLN: INTRAVENOUS | Status: DC

## 2020-05-16 NOTE — Progress Notes (Signed)
Inpatient Diabetes Program Recommendations  AACE/ADA: New Consensus Statement on Inpatient Glycemic Control (2015)  Target Ranges:  Prepandial:   less than 140 mg/dL      Peak postprandial:   less than 180 mg/dL (1-2 hours)      Critically ill patients:  140 - 180 mg/dL   Lab Results  Component Value Date   GLUCAP 166 (H) 05/16/2020   HGBA1C 16.0 (H) 05/15/2020    Review of Glycemic Control  Diabetes history: DM Outpatient Diabetes medications: Novolog 70/30 20 units bid Current orders for Inpatient glycemic control: IV insulin with transition treatment of Novolog 70/30 20 units bid + Novolog moderate correction tid  Inpatient Diabetes Program Recommendations:   Patient was started on insulin @ last admission with new onset diabetes. Spoke with patient via phone (DM coordinator @ [facility]). Patient states he ran out of insulin and didn't have money to purchase additional insulin, so did without insulin for the past several days. Discussed options of Novolin Relion insulin @ [facility] 70/30 insulin pens for approximately $42 per box. Patient appreciates information regarding options for cheaper insulin.  Patient needs on discharge: #[id number] Novolin Relion insulin 70/30  Thank you, Billy Fischer. Natalina Wieting, RN, MSN, CDE  Diabetes Coordinator Inpatient Glycemic Control Team Team Pager [phone] (8am-5pm) 05/16/2020 11:57 AM

## 2020-05-16 NOTE — ED Notes (Signed)
Assumed patient care.

## 2020-05-16 NOTE — ED Notes (Signed)
Contacted Denny,NP about patient current status with Insulin Gtt, due to patient c02 patient has to be restarted on insulin gtt @ this time

## 2020-05-16 NOTE — Progress Notes (Signed)
PROGRESS NOTE    Louis Vargas  BHA:[id number] DOB: Oct 09, 1981 DOA: 05/15/2020 PCP: Patient, No Pcp Per   Chef Complaints: Abdominal pain nausea vomiting  Brief Narrative: 39 year old man with recently diagnosed diabetes mellitus on Novolin 70/30 20 units twice daily, Novolin FlexPen sliding scale, hypertriglyceridemia on Lipitor and fenofibrate, prior Covid in January 2021 tested positive w/ Covid in May 2021 presented with abdominal pain nausea and vomiting, after running out of insulin about a week and has not taken any insulin since then.  Complains of being constipated and no bowel movement in a week.  Supposed to follow-up with endocrinologist Dr. Belva Crome but had no-show x2. In the ED afebrile blood pressure stable saturating well on room air, not tachycardic or tachypneic.  VBG pH 7.15 labs showed bicarb 11 anion gap 23 glucose 387 CBC stable.  Patient was found to have DKA and was placed on insulin drip and was admitted.  Subjective:  Seen this morning.  Patient is alert awake.  No new complaints not in distress. Remains on insulin drip, he would like to eat, no nausea and vomiting now Also complains of constipation   Assessment & Plan:  DKA due to noncompliance with insulin: Patient may have missed  follow-up appointments and seems he ran out of insulin for a week before showing up in the ED with acute sickness.  Anion gap 23 blood sugar in mid 300, beta hydroxybutyric acid elevated 6.45> 2.6.Blood sugar is improving. Anion Gap closed and hco3 better at 16.  We will start back on the 70/30 insulin sliding scale, carbohydrate diet and stop IV insulin 1 hr after subcu insulin. Recent Labs  Lab 05/16/20 0615 05/16/20 0723 05/16/20 0827 05/16/20 0934 05/16/20 1103  GLUCAP 292* 241* 239* 177* 166*   Recently diagnosed diabetes mellitus on insulin, he has missed 2 appointments with endocrinologist.  Poorly controlled A1c at 16.0.  Consult diabetes mellitus coordinator continue  diabetes management as per above Hypokalemia-replete Hypertriglyceridemia in 100 when he was in the hospital last time.  Supposed to be on Lipitor and fenofibrate.  We will resume his home meds.  Obesity with BMI 31: Will benefit with weight loss and healthy lifestyle and PCP follow-up. Pseudohyponatremia from hyperglycemia Isolated elevated bilirubin: Unclear significance  DVT prophylaxis: enoxaparin (LOVENOX) injection 40 mg Start: 05/16/20 1000 Code Status: FULL Family Communication: plan of care discussed with patient at bedside.  Status is: admitted as Observation Patient remains hospitalized for ongoing management of uncontrolled hyperglycemia with plan to transition IV to subcu insulin and monitor additional night, patient WILL BE changed to inpatient status.  Dispo: The patient is from: Home              Anticipated d/c is to: Home              Anticipated d/c date is: 1 day              Patient currently is not medically stable to d/c.   Nutrition: Diet Order            Diet Carb Modified Fluid consistency: Thin; Room service appropriate? Yes  Diet effective now                Body mass index is 31.06 kg/m. Consultants:see note  Procedures:see note Microbiology:see note  Medications: Scheduled Meds: . atorvastatin  40 mg Oral Daily  . enoxaparin (LOVENOX) injection  40 mg Subcutaneous Q24H  . fenofibrate  160 mg Oral Daily  . insulin aspart  0-15 Units Subcutaneous TID WC  . insulin aspart protamine- aspart  20 Units Subcutaneous BID WC  . polyethylene glycol  17 g Oral Daily  . senna-docusate  2 tablet Oral BID   Continuous Infusions: . dextrose 5 % and 0.45% NaCl 75 mL/hr at 05/16/20 1105  . insulin 6.5 Units/hr (05/16/20 1105)    Antimicrobials: Anti-infectives (From admission, onward)   None    Objective: Vitals: Today's Vitals   05/16/20 1000 05/16/20 1030 05/16/20 1031 05/16/20 1100  BP: 102/60 104/62 104/62 107/68  Pulse: 71 73 70 69  Resp:  13 13 12 11   Temp:      TempSrc:      SpO2: 96% 96% 98% 100%  Weight:      Height:      PainSc:        Intake/Output Summary (Last 24 hours) at 05/16/2020 1118 Last data filed at 05/16/2020 1105 Gross per 24 hour  Intake 453.89 ml  Output --  Net 453.89 ml   Filed Weights   05/15/20 1129  Weight: 103.9 kg   Weight change:    Intake/Output from previous day: No intake/output data recorded. Intake/Output this shift: Total I/O In: 453.9 [I.V.:270.6; IV Piggyback:183.3] Out: -   Examination:  General exam: AAOX3 ,NAD, weak appearing. HEENT:Oral mucosa moist, Ear/Nose WNL grossly,dentition normal. Respiratory system: bilaterally CLEAR,no wheezing or crackles,no use of accessory muscle, non tender. Cardiovascular system: S1 & S2 +, regular, No JVD. Gastrointestinal system: Abdomen soft, NT,ND, BS+. Nervous System:Alert, awake, moving extremities and grossly nonfocal Extremities: No edema, distal peripheral pulses palpable.  Skin: No rashes,no icterus. MSK: Normal muscle bulk,tone, power  Data Reviewed: I have personally reviewed following labs and imaging studies CBC: Recent Labs  Lab 05/15/20 1225  WBC 4.2  HGB 13.6  HCT 45.2  MCV 77.5*  PLT 280   Basic Metabolic Panel: Recent Labs  Lab 05/15/20 1225 05/15/20 1742 05/16/20 0450 05/16/20 0815  NA 131* 134* 132* 132*  K 4.1 5.0 3.3* 3.0*  CL 97* 105 105 102  CO2 11* 13* 13* 15*  GLUCOSE 387* 228* 256* 231*  BUN 12 11 9 10   CREATININE 1.03 0.94 0.77 0.85  CALCIUM 9.5 8.2* 7.8* 8.6*   GFR: Estimated Creatinine Clearance: 144 mL/min (by C-G formula based on SCr of 0.85 mg/dL). Liver Function Tests: Recent Labs  Lab 05/15/20 1225  AST 16  ALT 19  ALKPHOS 90  BILITOT 1.8*  PROT 7.4  ALBUMIN 4.1   Recent Labs  Lab 05/15/20 1225  LIPASE 26   No results for input(s): AMMONIA in the last 168 hours. Coagulation Profile: No results for input(s): INR, PROTIME in the last 168 hours. Cardiac  Enzymes: No results for input(s): CKTOTAL, CKMB, CKMBINDEX, TROPONINI in the last 168 hours. BNP (last 3 results) No results for input(s): PROBNP in the last 8760 hours. HbA1C: Recent Labs    05/15/20 1225  HGBA1C 16.0*   CBG: Recent Labs  Lab 05/16/20 0615 05/16/20 0723 05/16/20 0827 05/16/20 0934 05/16/20 1103  GLUCAP 292* 241* 239* 177* 166*   Lipid Profile: Recent Labs    05/15/20 1742  CHOL 336*  HDL 20*  LDLCALC UNABLE TO CALCULATE IF TRIGLYCERIDE OVER 400 mg/dL  TRIG 05/18/20*  CHOLHDL NOT REPORTED DUE TO HIGH TRIGLYCERIDES  LDLDIRECT UNABLE TO CALCULATE IF TRIGLYCERIDE IS >1293 mg/dL   Thyroid Function Tests: No results for input(s): TSH, T4TOTAL, FREET4, T3FREE, THYROIDAB in the last 72 hours. Anemia Panel: No results for input(s): VITAMINB12, FOLATE,  FERRITIN, TIBC, IRON, RETICCTPCT in the last 72 hours. Sepsis Labs: No results for input(s): PROCALCITON, LATICACIDVEN in the last 168 hours.  Recent Results (from the past 240 hour(s))  SARS Coronavirus 2 by RT PCR (hospital order, performed in [facility] hospital lab) Nasopharyngeal Nasopharyngeal Swab     Status: None   Collection Time: 05/15/20  4:26 PM   Specimen: Nasopharyngeal Swab  Result Value Ref Range Status   SARS Coronavirus 2 NEGATIVE NEGATIVE Final    Comment: (NOTE) SARS-CoV-2 target nucleic acids are NOT DETECTED.  The SARS-CoV-2 RNA is generally detectable in upper and lower respiratory specimens during the acute phase of infection. The lowest concentration of SARS-CoV-2 viral copies this assay can detect is 250 copies / mL. A negative result does not preclude SARS-CoV-2 infection and should not be used as the sole basis for treatment or other patient management decisions.  A negative result may occur with improper specimen collection / handling, submission of specimen other than nasopharyngeal swab, presence of viral mutation(s) within the areas targeted by this assay, and inadequate  number of viral copies (<250 copies / mL). A negative result must be combined with clinical observations, patient history, and epidemiological information.  Fact Sheet for Patients:   BoilerBrush.com.cy  Fact Sheet for Healthcare Providers: https://pope.com/  This test is not yet approved or  cleared by the [place] FDA and has been authorized for detection and/or diagnosis of SARS-CoV-2 by FDA under an Emergency Use Authorization (EUA).  This EUA will remain in effect (meaning this test can be used) for the duration of the COVID-19 declaration under Section 564(b)(1) of the Act, 21 U.S.C. section 360bbb-3(b)(1), unless the authorization is terminated or revoked sooner.  Performed at [facility], [address], [place], [place] [address]       Radiology Studies: DG Chest Portable 1 View  Result Date: 05/15/2020 CLINICAL DATA:  Cough, nausea and vomiting, abdominal pain EXAM: PORTABLE CHEST 1 VIEW COMPARISON:  03/04/2020 FINDINGS: The heart size and mediastinal contours are within normal limits. Both lungs are clear. The visualized skeletal structures are unremarkable. IMPRESSION: No active disease. Electronically Signed   By: Sharlet Salina M.D.   On: 05/15/2020 16:16   DG Abd Portable 2V  Result Date: 05/15/2020 CLINICAL DATA:  Nausea and vomiting EXAM: PORTABLE ABDOMEN - 2 VIEW COMPARISON:  None. FINDINGS: There is mildly prominent air-filled loops small bowel seen within the right mid abdomen. There is a moderate to large amount of colonic stool down to the level of the rectum. There is no evidence of free air. No radio-opaque calculi or other significant radiographic abnormality is seen. IMPRESSION: Moderate to large amount of colonic stool with mildly prominent air-filled loops of small bowel in the right mid abdomen which could be due to ileus versus early partial small bowel obstruction. Electronically  Signed   By: Jonna Clark M.D.   On: 05/15/2020 17:25     LOS: 0 days   Lanae Boast, MD [facility]  05/16/2020, 11:18 AM

## 2020-05-17 LAB — BASIC METABOLIC PANEL
Anion gap: 11 (ref 5–15)
BUN: 7 mg/dL (ref 6–20)
CO2: 22 mmol/L (ref 22–32)
Calcium: 8.7 mg/dL — ABNORMAL LOW (ref 8.9–10.3)
Chloride: 101 mmol/L (ref 98–111)
Creatinine, Ser: 0.68 mg/dL (ref 0.61–1.24)
GFR calc Af Amer: >60 mL/min (ref >60)
GFR calc non Af Amer: >60 mL/min (ref >60)
Glucose, Bld: 189 mg/dL — ABNORMAL HIGH (ref 70–99)
Potassium: 3 mmol/L — ABNORMAL LOW (ref 3.5–5.1)
Sodium: 134 mmol/L — ABNORMAL LOW (ref 135–145)

## 2020-05-17 LAB — GLUCOSE, CAPILLARY
Glucose-Capillary: 176 mg/dL — ABNORMAL HIGH (ref 70–99)
Glucose-Capillary: 188 mg/dL — ABNORMAL HIGH (ref 70–99)
Glucose-Capillary: 251 mg/dL — ABNORMAL HIGH (ref 70–99)

## 2020-05-17 MED ORDER — "PEN NEEDLES 3/16"" 31G X 5 MM MISC": 1.0000 | Freq: Three times a day (TID) | 0 refills | Status: DC

## 2020-05-17 MED ORDER — POTASSIUM CHLORIDE CRYS ER 20 MEQ PO TBCR
20.0000 meq | EXTENDED_RELEASE_TABLET | Freq: Every day | ORAL | Status: DC
  Administered 2020-05-17: 20 meq via ORAL
  Filled 2020-05-17: qty 1

## 2020-05-17 MED ORDER — INSULIN ASPART PROT & ASPART (70-30 MIX) 100 UNIT/ML ~~LOC~~ SUSP
25.0000 [IU] | Freq: Two times a day (BID) | SUBCUTANEOUS | Status: DC
  Administered 2020-05-17: 25 [IU] via SUBCUTANEOUS
  Filled 2020-05-17: qty 10

## 2020-05-17 MED ORDER — NOVOLIN R FLEXPEN RELION 100 UNIT/ML IJ SOPN: 0.0000 [IU] | PEN_INJECTOR | Freq: Three times a day (TID) | INTRAMUSCULAR | 1 refills | Status: DC

## 2020-05-17 MED ORDER — FENOFIBRATE 160 MG PO TABS: 160.0000 mg | ORAL_TABLET | Freq: Every day | ORAL | 0 refills | Status: DC

## 2020-05-17 MED ORDER — NOVOLIN 70/30 FLEXPEN RELION (70-30) 100 UNIT/ML ~~LOC~~ SUPN: 25.0000 [IU] | PEN_INJECTOR | Freq: Two times a day (BID) | SUBCUTANEOUS | 1 refills | Status: DC

## 2020-05-17 MED ORDER — POTASSIUM CHLORIDE CRYS ER 20 MEQ PO TBCR
40.0000 meq | EXTENDED_RELEASE_TABLET | Freq: Once | ORAL | Status: AC
  Administered 2020-05-17: 40 meq via ORAL
  Filled 2020-05-17: qty 2

## 2020-05-17 MED ORDER — ATORVASTATIN CALCIUM 40 MG PO TABS: 40.0000 mg | ORAL_TABLET | Freq: Every day | ORAL | 1 refills | Status: DC

## 2020-05-17 MED ORDER — POTASSIUM CHLORIDE CRYS ER 20 MEQ PO TBCR: 20.0000 meq | EXTENDED_RELEASE_TABLET | Freq: Every day | ORAL | 0 refills | Status: DC

## 2020-05-17 NOTE — Progress Notes (Signed)
Spoke with patient about his diabetes. Reviewed a schedule for administering his insulin at home and work.   Recommend this schedule per discharge insulin orders: Schedule for taking insulin: 7 am check blood sugar- take Regular insulin per sliding scale          Take 70/30 insulin 25 units  12 noon  Check blood sugar- take Regular insulin per sliding scale         6 pm   Check blood sugar-take Regular insulin per sliding scale             Take 70/30 insulin 25 units  12 MN (after work) check blood sugar            NO insulin at this time.    New follow up PCP appointment:  [facility] appointment: Monday 05/21/20 at 1:50 pm.  Be there 20 minutes early at 1:30 pm to fill out paperwork.  Will see Dr. Alvis Lemmings. Address: [address]   Smith Mince RN BSN CDE Diabetes Coordinator Pager: [phone]  8am-5pm

## 2020-05-17 NOTE — Plan of Care (Signed)

## 2020-05-17 NOTE — Discharge Instructions (Signed)
Glucose Products:  ReliOnT glucose products raise low blood sugar fast. Tablets are free of fat, caffeine, sodium and gluten. They are portable and easy to carry, making it easier for people with diabetes to BE PREPARED for lows.  Glucose Tablets Available in 6 flavors . 10 ct...................................... $1.00 . 50 ct...................................... $3.98 Glucose Shot..................................$1.48 Glucose Gel....................................$3.44  Alcohol Swabs Alcohol swabs are used to sterilize your injection site. All of our swabs are individually wrapped for maximum safety, convenience and moisture retention. ReliOnT Alcohol Swabs . 100 ct Swabs..............................$1.00 . 400 ct Swabs..............................$3.74  Lancets ReliOnT offers three lancet options conveniently designed to work with almost every lancing device. Each features a protective disk, which guarantees sterility before testing. ReliOnT Lancets . 100 ct Lancets $1.56 . 200 ct Lancets $2.64 Available in Ultra-Thin, Thin & Micro-Thin ReliOnT 2-IN-1 Lancing Device . 50 ct Lancets..................................... $3.44 Available in 30 gauge and 25 gauge ReliOnT Lancing Device....................$5.84  Blood Glucose Monitors ReliOnT offers a full range of blood glucose testing options to provide an accurate, affordable system that meets each person's unique needs and preferences. Prime Meter....................................... $9.00 Prime Test Strips . 25 test strips.................................... $5.00 . 50 test strips.................................... $9.00 . 100 test strips.................................$17.88 Premier BLU Meter  ............  $18.98 Premier Voice Meter  .............  $14.98 Premier Test Strips . 50 test strips.................................... $9.00 . 100 test strips.................................$17.88 Premier [employer]   ............  $19.44 Kit includes: . 50 test strips . 10 lancets . Lancing device . Carry case  Ketone Test Strips . 50 test strips  ................  $6.64  Human Insulin  Novolin/ReliOnT (recombinant DNA origin) is manufactured for [employer] by [employer] Insulin* with Vial..........$24.88 Available in N, R, 70/30 Novolin/ReliOnT Insulin Pens*  .....  $42.88 Available in N, R, 70/30  Insulin Delivery ReliOnT syringes and pen needles provide precision technology, comfort and accuracy in insulin delivery at affordable prices. ReliOnT Pen Needles* . 50 ct....................................................$9.00 Available in 67m, 644m 82m96m 100m22mliOnT Insulin Syringes* . 100 ct ............ $12.58 Available in 29G, 30G & 31G (3/10cc, 1/2cc & 1cc units)        Schedule for taking insulin: 7 am check blood sugar- take Regular insulin per sliding scale          Take 70/30 insulin 25 units  12 noon  Check blood sugar- take Regular insulin per sliding scale         6 pm   Check blood sugar-take Regular insulin per sliding scale             Take 70/30 insulin 25 units  12 MN (after work) check blood sugar            NO insulin at this time.   [facility]: [facility].  Be there 20 minutes early at 1:30 pm to fill out paperwork.  Will see Dr. NewlMargarita Ranadress: [phone]

## 2020-05-17 NOTE — Discharge Summary (Signed)
Physician Discharge Summary  Louis Vargas QMV:[id number] DOB: [birth year] DOA: 05/15/2020  PCP: Patient, No Pcp Per  Admit date: 05/15/2020 Discharge date: 05/17/2020  Admitted From: home Disposition:  home  Recommendations for Outpatient Follow-up:  1. Follow up with PCP in 1-2 weeks 2. Please obtain BMP/CBC in one week 3. Please follow up on the following pending results:  Home Health:No  Equipment/Devices: None  Discharge Condition: Stable Code Status: FULL Diet recommendation:  Diet Order            Diet - low sodium heart healthy           Diet Carb Modified Fluid consistency: Thin; Room service appropriate? Yes  Diet effective now                  Brief/Interim Summary: 39 year old man with recently diagnosed diabetes mellitus on Novolin 70/30 20 units twice daily, Novolin FlexPen sliding scale, hypertriglyceridemia on Lipitor and fenofibrate, prior Covid in January 2021 tested positive w/ Covid in May 2021 presented with abdominal pain nausea and vomiting, after running out of insulin about a week and has not taken any insulin since then.  Complains of being constipated and no bowel movement in a week.  Supposed to follow-up with endocrinologist Dr. Layla Maw but had no-show x2. In the ED afebrile blood pressure stable saturating well on room air, not tachycardic or tachypneic.  VBG pH 7.15 labs showed bicarb 11 anion gap 23 glucose 387 CBC stable.  Patient was found to have DKA and was placed on insulin drip and was admitted. Patient was admitted, anion gap closed transition to subcu insulin. He was counseled extensively on need to not stop taking his insulin and seek immediate care. Seen by diabetic coordinator. Insulin IncreaseD to 25 units, he can also take a sliding scale insulin at home. He understands the need to stay on insulin and always call his MD before running out.  Discharge Diagnoses:   DKA due to noncompliance with insulin: Resolved.  Back on insulin  continue Novolin 70/30 25 twice daily and sliding scale.  GERD extensively.  He tells me that he is not going to skip any insulin dose.  I had lengthy discussion with him Obesity with BMI he'll benefit with weight loss, advised to follow-up with PCP Hypokalemia replaced we'll give him prescription for a few days potassium chloride Recently diagnosed diabetes mellitus A1c 16.0 poorly controlled.  Educated on need for close follow-up with his endocrinologist. Hypertriglyceridemia continue his Lipitor and fenofibrate.   Subjective: Doing well no nausea vomiting fever chills.  Having bowel movement.  Blood sugars controlled.  Discharge Exam: Vitals:   05/17/20 0500 05/17/20 1344  BP:  123/62  Pulse: 71 78  Resp:  17  Temp: 98.5 F (36.9 C) 98.4 F (36.9 C)  SpO2:  97%   General: Pt is alert, awake, not in acute distress Cardiovascular: RRR, S1/S2 +, no rubs, no gallops Respiratory: CTA bilaterally, no wheezing, no rhonchi Abdominal: Soft, NT, ND, bowel sounds + Extremities: no edema, no cyanosis  Discharge Instructions  Discharge Instructions    Diet - low sodium heart healthy   Complete by: As directed    Discharge instructions   Complete by: As directed    Check blood sugar 3 times a day and bedtime at home. If blood sugar running above 200 less than 70 please call your MD to adjust insulin. If blood sugars running less 100 do not use insulin and call MD. If you noticed signs  and symptoms of hypoglycemia or low blood sugar like jitteriness, confusion, thirst, tremor, sweating- Check blood sugar, drink sugary drink/biscuits/sweets to increase sugar level and call MD or return to ER.  Please call call MD or return to ER for similar or worsening recurring problem that brought you to hospital or if any fever,nausea/vomiting,abdominal pain, uncontrolled pain, chest pain,  shortness of breath or any other alarming symptoms.  Please follow-up your doctor as instructed in a week time  and call the office for appointment.  Please avoid alcohol, smoking, or any other illicit substance and maintain healthy habits including taking your regular medications as prescribed.  You were cared for by a hospitalist during your hospital stay. If you have any questions about your discharge medications or the care you received while you were in the hospital after you are discharged, you can call the unit and ask to speak with the hospitalist on call if the hospitalist that took care of you is not available.  Once you are discharged, your primary care physician will handle any further medical issues. Please note that NO REFILLS for any discharge medications will be authorized once you are discharged, as it is imperative that you return to your primary care physician (or establish a relationship with a primary care physician if you do not have one) for your aftercare needs so that they can reassess your need for medications and monitor your lab values   Increase activity slowly   Complete by: As directed      Allergies as of 05/17/2020      Reactions   Iodinated Diagnostic Agents Swelling      Medication List    TAKE these medications   atorvastatin 40 MG tablet Commonly known as: Lipitor Take 1 tablet (40 mg total) by mouth daily.   blood glucose meter kit and supplies Dispense based on patient and insurance preference. Use up to four times daily as directed. (FOR ICD-10 E10.9, E11.9).   fenofibrate 160 MG tablet Take 1 tablet (160 mg total) by mouth daily.   NovoLIN 70/30 FlexPen Relion (70-30) 100 UNIT/ML KwikPen Generic drug: insulin isophane & regular human Inject 25 Units into the skin 2 (two) times daily. What changed: how much to take   NovoLIN R FlexPen ReliOn 100 UNIT/ML Sopn Generic drug: Insulin Regular Human Inject 0-15 Units as directed in the morning, at noon, and at bedtime.   Pen Needles 3/16" 31G X 5 MM Misc 1 each by Does not apply route in the morning, at  noon, and at bedtime.   potassium chloride SA 20 MEQ tablet Commonly known as: KLOR-CON Take 1 tablet (20 mEq total) by mouth daily for 7 days. Start taking on: May 18, 2020       Allergies  Allergen Reactions  . Iodinated Diagnostic Agents Swelling    The results of significant diagnostics from this hospitalization (including imaging, microbiology, ancillary and laboratory) are listed below for reference.    Microbiology: Recent Results (from the past 240 hour(s))  SARS Coronavirus 2 by RT PCR (hospital order, performed in [facility] hospital lab) Nasopharyngeal Nasopharyngeal Swab     Status: None   Collection Time: 05/15/20  4:26 PM   Specimen: Nasopharyngeal Swab  Result Value Ref Range Status   SARS Coronavirus 2 NEGATIVE NEGATIVE Final    Comment: (NOTE) SARS-CoV-2 target nucleic acids are NOT DETECTED.  The SARS-CoV-2 RNA is generally detectable in upper and lower respiratory specimens during the acute phase of infection. The lowest  concentration of SARS-CoV-2 viral copies this assay can detect is 250 copies / mL. A negative result does not preclude SARS-CoV-2 infection and should not be used as the sole basis for treatment or other patient management decisions.  A negative result may occur with improper specimen collection / handling, submission of specimen other than nasopharyngeal swab, presence of viral mutation(s) within the areas targeted by this assay, and inadequate number of viral copies (<250 copies / mL). A negative result must be combined with clinical observations, patient history, and epidemiological information.  Fact Sheet for Patients:   StrictlyIdeas.no  Fact Sheet for Healthcare Providers: BankingDealers.co.za  This test is not yet approved or  cleared by the [place] FDA and has been authorized for detection and/or diagnosis of SARS-CoV-2 by FDA under an Emergency Use Authorization (EUA).   This EUA will remain in effect (meaning this test can be used) for the duration of the COVID-19 declaration under Section 564(b)(1) of the Act, 21 U.S.C. section 360bbb-3(b)(1), unless the authorization is terminated or revoked sooner.  Performed at [facility], [place] [address] [place], [place] [address]     Procedures/Studies: DG Chest Portable 1 View  Result Date: 05/15/2020 CLINICAL DATA:  Cough, nausea and vomiting, abdominal pain EXAM: PORTABLE CHEST 1 VIEW COMPARISON:  03/04/2020 FINDINGS: The heart size and mediastinal contours are within normal limits. Both lungs are clear. The visualized skeletal structures are unremarkable. IMPRESSION: No active disease. Electronically Signed   By: Randa Ngo M.D.   On: 05/15/2020 16:16   DG Abd Portable 2V  Result Date: 05/15/2020 CLINICAL DATA:  Nausea and vomiting EXAM: PORTABLE ABDOMEN - 2 VIEW COMPARISON:  None. FINDINGS: There is mildly prominent air-filled loops small bowel seen within the right mid abdomen. There is a moderate to large amount of colonic stool down to the level of the rectum. There is no evidence of free air. No radio-opaque calculi or other significant radiographic abnormality is seen. IMPRESSION: Moderate to large amount of colonic stool with mildly prominent air-filled loops of small bowel in the right mid abdomen which could be due to ileus versus early partial small bowel obstruction. Electronically Signed   By: Prudencio Pair M.D.   On: 05/15/2020 17:25    Labs: BNP (last 3 results) No results for input(s): BNP in the last 8760 hours. Basic Metabolic Panel: Recent Labs  Lab 05/16/20 0450 05/16/20 0815 05/16/20 1209 05/16/20 1842 05/17/20 0734  NA 132* 132* 134* 130* 134*  K 3.3* 3.0* 3.5 3.6 3.0*  CL 105 102 105 101 101  CO2 13* 15* 16* 17* 22  GLUCOSE 256* 231* 107* 334* 189*  BUN _0 CREATININE 0.77 0.85 0.71 0.89 0.68  CALCIUM 7.8* 8.6* 8.6* 8.6* 8.7*   Liver Function  Tests: Recent Labs  Lab 05/15/20 1225  AST 16  ALT 19  ALKPHOS 90  BILITOT 1.8*  PROT 7.4  ALBUMIN 4.1   Recent Labs  Lab 05/15/20 1225  LIPASE 26   No results for input(s): AMMONIA in the last 168 hours. CBC: Recent Labs  Lab 05/15/20 1225  WBC 4.2  HGB 13.6  HCT 45.2  MCV 77.5*  PLT 280   Cardiac Enzymes: No results for input(s): CKTOTAL, CKMB, CKMBINDEX, TROPONINI in the last 168 hours. BNP: Invalid input(s): POCBNP CBG: Recent Labs  Lab 05/16/20 2103 05/16/20 2353 05/17/20 0350 05/17/20 0744 05/17/20 1104  GLUCAP 349* 236* 176* 188* 251*   D-Dimer No results for input(s):  DDIMER in the last 72 hours. Hgb A1c Recent Labs    05/15/20 1225 05/16/20 0450  HGBA1C 16.0* 15.8*   Lipid Profile Recent Labs    05/15/20 1742  CHOL 336*  HDL 20*  LDLCALC UNABLE TO CALCULATE IF TRIGLYCERIDE OVER 400 mg/dL  TRIG 1,293*  CHOLHDL NOT REPORTED DUE TO HIGH TRIGLYCERIDES  LDLDIRECT UNABLE TO CALCULATE IF TRIGLYCERIDE IS >1293 mg/dL   Thyroid function studies No results for input(s): TSH, T4TOTAL, T3FREE, THYROIDAB in the last 72 hours.  Invalid input(s): FREET3 Anemia work up No results for input(s): VITAMINB12, FOLATE, FERRITIN, TIBC, IRON, RETICCTPCT in the last 72 hours. Urinalysis    Component Value Date/Time   COLORURINE STRAW (A) 05/15/2020 1426   APPEARANCEUR CLEAR 05/15/2020 1426   LABSPEC 1.030 05/15/2020 1426   PHURINE 5.0 05/15/2020 1426   GLUCOSEU >=500 (A) 05/15/2020 1426   HGBUR SMALL (A) 05/15/2020 1426   BILIRUBINUR NEGATIVE 05/15/2020 1426   KETONESUR 80 (A) 05/15/2020 1426   PROTEINUR 100 (A) 05/15/2020 1426   NITRITE NEGATIVE 05/15/2020 1426   LEUKOCYTESUR NEGATIVE 05/15/2020 1426   Sepsis Labs Invalid input(s): PROCALCITONIN,  WBC,  LACTICIDVEN Microbiology Recent Results (from the past 240 hour(s))  SARS Coronavirus 2 by RT PCR (hospital order, performed in [facility] hospital lab) Nasopharyngeal Nasopharyngeal Swab      Status: None   Collection Time: 05/15/20  4:26 PM   Specimen: Nasopharyngeal Swab  Result Value Ref Range Status   SARS Coronavirus 2 NEGATIVE NEGATIVE Final    Comment: (NOTE) SARS-CoV-2 target nucleic acids are NOT DETECTED.  The SARS-CoV-2 RNA is generally detectable in upper and lower respiratory specimens during the acute phase of infection. The lowest concentration of SARS-CoV-2 viral copies this assay can detect is 250 copies / mL. A negative result does not preclude SARS-CoV-2 infection and should not be used as the sole basis for treatment or other patient management decisions.  A negative result may occur with improper specimen collection / handling, submission of specimen other than nasopharyngeal swab, presence of viral mutation(s) within the areas targeted by this assay, and inadequate number of viral copies (<250 copies / mL). A negative result must be combined with clinical observations, patient history, and epidemiological information.  Fact Sheet for Patients:   StrictlyIdeas.no  Fact Sheet for Healthcare Providers: BankingDealers.co.za  This test is not yet approved or  cleared by the [place] FDA and has been authorized for detection and/or diagnosis of SARS-CoV-2 by FDA under an Emergency Use Authorization (EUA).  This EUA will remain in effect (meaning this test can be used) for the duration of the COVID-19 declaration under Section 564(b)(1) of the Act, 21 U.S.C. section 360bbb-3(b)(1), unless the authorization is terminated or revoked sooner.  Performed at [facility], [place] [address], [place], [place] [address]      Time coordinating discharge: 25  minutes  SIGNED: Antonieta Pert, MD  [facility] 05/17/2020, 1:46 PM  If 7PM-7AM, please contact night-coverage [email or web address]

## 2020-05-21 ENCOUNTER — Encounter: Payer: Self-pay | Admitting: Family Medicine

## 2020-05-21 ENCOUNTER — Other Ambulatory Visit: Payer: Self-pay

## 2020-05-21 ENCOUNTER — Ambulatory Visit: Payer: Self-pay | Attending: Family Medicine | Admitting: Family Medicine

## 2020-05-21 VITALS — BP 108/67 | HR 84 | Ht 72.0 in | Wt 237.4 lb

## 2020-05-21 DIAGNOSIS — E876 Hypokalemia: Secondary | ICD-10-CM

## 2020-05-21 DIAGNOSIS — E1165 Type 2 diabetes mellitus with hyperglycemia: Secondary | ICD-10-CM

## 2020-05-21 DIAGNOSIS — Z794 Long term (current) use of insulin: Secondary | ICD-10-CM

## 2020-05-21 LAB — GLUCOSE, POCT (MANUAL RESULT ENTRY)
POC Glucose: 300 mg/dl — AB (ref 70–99)
POC Glucose: 359 mg/dl — AB (ref 70–99)

## 2020-05-21 MED ORDER — INSULIN ASPART 100 UNIT/ML ~~LOC~~ SOLN
6.0000 [IU] | Freq: Once | SUBCUTANEOUS | Status: AC
  Administered 2020-05-21: 6 [IU] via SUBCUTANEOUS

## 2020-05-21 MED ORDER — NOVOLIN 70/30 FLEXPEN RELION (70-30) 100 UNIT/ML ~~LOC~~ SUPN: 30.0000 [IU] | PEN_INJECTOR | Freq: Two times a day (BID) | SUBCUTANEOUS | 3 refills | Status: DC

## 2020-05-21 NOTE — Progress Notes (Signed)
CBG: 359 

## 2020-05-21 NOTE — Progress Notes (Signed)
Subjective:  Patient ID: Louis Vargas, male    DOB: 07/15/1981  Age: 39 y.o. MRN: [id number]  CC: New Patient (Initial Visit) and Diabetes   HPI Louis Vargas is a 39 year old male with a history of type 2 diabetes mellitus (A1c 15.8) diagnosed at this year presents today to establish care. Hospitalized 05/15/2020 through 05/17/2020 for diabetic ketoacidosis -see discharge notes for details. He has been compliant with his medications - takes 25 units of Novolog 70/30 and Novolog 8 units per sliding scale, last meal was about 4 hours ago when his blood sugar in the clinic is 359.  He informs me he is due for his afternoon dose of NovoLog. He did not receive much education with regards to a diabetic diet when he was diagnosed. He comes in today accompanied by his spouse. He has no additional concerns today. Past Medical History:  Diagnosis Date  . Diabetes mellitus without complication ([place])   . Obesity (BMI 30.0-34.9)     History reviewed. No pertinent surgical history.  Family History  Problem Relation Age of Onset  . Diabetes Mother   . Diabetes Sister   . Diabetes Brother     Allergies  Allergen Reactions  . Iodinated Diagnostic Agents Swelling    Outpatient Medications Prior to Visit  Medication Sig Dispense Refill  . atorvastatin (LIPITOR) 40 MG tablet Take 1 tablet (40 mg total) by mouth daily. 30 tablet 1  . blood glucose meter kit and supplies Dispense based on patient and insurance preference. Use up to four times daily as directed. (FOR ICD-10 E10.9, E11.9). 1 each 0  . fenofibrate 160 MG tablet Take 1 tablet (160 mg total) by mouth daily. 30 tablet 0  . Insulin Pen Needle (PEN NEEDLES 3/16") 31G X 5 MM MISC 1 each by Does not apply route in the morning, at noon, and at bedtime. 100 each 0  . Insulin Regular Human (NOVOLIN R FLEXPEN RELION) 100 UNIT/ML SOPN Inject 0-15 Units as directed in the morning, at noon, and at bedtime. 15 mL 1  . potassium chloride SA  (KLOR-CON) 20 MEQ tablet Take 1 tablet (20 mEq total) by mouth daily for 7 days. 7 tablet 0  . insulin isophane & regular human (NOVOLIN 70/30 FLEXPEN RELION) (70-30) 100 UNIT/ML KwikPen Inject 25 Units into the skin 2 (two) times daily. 15 mL 1  . atorvastatin (LIPITOR) 40 MG tablet Take 1 tablet (40 mg total) by mouth daily. (Patient not taking: Reported on 05/15/2020) 30 tablet 1   No facility-administered medications prior to visit.     ROS Review of Systems  Constitutional: Negative for activity change and appetite change.  HENT: Negative for sinus pressure and sore throat.   Eyes: Negative for visual disturbance.  Respiratory: Negative for cough, chest tightness and shortness of breath.   Cardiovascular: Negative for chest pain and leg swelling.  Gastrointestinal: Negative for abdominal distention, abdominal pain, constipation and diarrhea.  Endocrine: Negative.   Genitourinary: Negative for dysuria.  Musculoskeletal: Negative for joint swelling and myalgias.  Skin: Negative for rash.  Allergic/Immunologic: Negative.   Neurological: Negative for weakness, light-headedness and numbness.  Psychiatric/Behavioral: Negative for dysphoric mood and suicidal ideas.    Objective:  BP 108/67   Pulse 84   Ht 6' (1.829 m)   Wt 237 lb 6.4 oz (107.7 kg)   SpO2 97%   BMI 32.20 kg/m   BP/Weight 05/21/2020 05/17/2020 03/31/4131  Systolic BP 440 102 -  Diastolic BP 67 62 -  Wt. (Lbs) 237.4 - 229  BMI 32.2 - 31.06      Physical Exam Constitutional:      Appearance: He is well-developed.  Neck:     Vascular: No JVD.  Cardiovascular:     Rate and Rhythm: Normal rate.     Heart sounds: Normal heart sounds. No murmur heard.   Pulmonary:     Effort: Pulmonary effort is normal.     Breath sounds: Normal breath sounds. No wheezing or rales.  Chest:     Chest wall: No tenderness.  Abdominal:     General: Bowel sounds are normal. There is no distension.     Palpations: Abdomen is  soft. There is no mass.     Tenderness: There is no abdominal tenderness.  Musculoskeletal:        General: Normal range of motion.     Right lower leg: No edema.     Left lower leg: No edema.  Neurological:     Mental Status: He is alert and oriented to person, place, and time.  Psychiatric:        Mood and Affect: Mood normal.     CMP Latest Ref Rng & Units 05/17/2020 05/16/2020 05/16/2020  Glucose 70 - 99 mg/dL 189(H) 334(H) 107(H)  BUN 6 - 20 mg/dL '7 10 8  ' Creatinine 0.61 - 1.24 mg/dL 0.68 0.89 0.71  Sodium 135 - 145 mmol/L 134(L) 130(L) 134(L)  Potassium 3.5 - 5.1 mmol/L 3.0(L) 3.6 3.5  Chloride 98 - 111 mmol/L 101 101 105  CO2 22 - 32 mmol/L 22 17(L) 16(L)  Calcium 8.9 - 10.3 mg/dL 8.7(L) 8.6(L) 8.6(L)  Total Protein 6.5 - 8.1 g/dL - - -  Total Bilirubin 0.3 - 1.2 mg/dL - - -  Alkaline Phos 38 - 126 U/L - - -  AST 15 - 41 U/L - - -  ALT 0 - 44 U/L - - -    Lipid Panel     Component Value Date/Time   CHOL 336 (H) 05/15/2020 1742   TRIG 1,293 (H) 05/15/2020 1742   HDL 20 (L) 05/15/2020 1742   CHOLHDL NOT REPORTED DUE TO HIGH TRIGLYCERIDES 05/15/2020 1742   VLDL UNABLE TO CALCULATE IF TRIGLYCERIDE OVER 400 mg/dL 05/15/2020 1742   LDLCALC UNABLE TO CALCULATE IF TRIGLYCERIDE OVER 400 mg/dL 05/15/2020 1742   LDLDIRECT UNABLE TO CALCULATE IF TRIGLYCERIDE IS >1293 mg/dL 05/15/2020 1742    CBC    Component Value Date/Time   WBC 4.2 05/15/2020 1225   RBC 5.83 (H) 05/15/2020 1225   HGB 13.6 05/15/2020 1225   HCT 45.2 05/15/2020 1225   PLT 280 05/15/2020 1225   MCV 77.5 (L) 05/15/2020 1225   MCH 23.3 (L) 05/15/2020 1225   MCHC 30.1 05/15/2020 1225   RDW 14.8 05/15/2020 1225   LYMPHSABS 1.7 03/06/2020 0136   MONOABS 0.3 03/06/2020 0136   EOSABS 0.1 03/06/2020 0136   BASOSABS 0.0 03/06/2020 0136    Lab Results  Component Value Date   HGBA1C 15.8 (H) 05/16/2020    Assessment & Plan:  1. Type 2 diabetes mellitus with hyperglycemia, with long-term current use of  insulin (HCC) Uncontrolled with A1c of 15.8; goal is less than 7.0 Elevated blood sugar of 359 in the clinic today, NovoLog 6 units administered and patient observed for 30 minutes after which blood sugar repeated. Increase Novolin 70/30 to 30 units twice daily Clinical pharmacist called in to provide diabetic education We will review blood sugar log at next visit Counseled  on Diabetic diet, my plate method, 582 minutes of moderate intensity exercise/week Blood sugar logs with fasting goals of 80-120 mg/dl, random of less than 180 and in the event of sugars less than 60 mg/dl or greater than 400 mg/dl encouraged to notify the clinic. Advised on the need for annual eye exams, annual foot exams, Pneumonia vaccine. - POCT glucose (manual entry) - insulin isophane & regular human (NOVOLIN 70/30 FLEXPEN RELION) (70-30) 100 UNIT/ML KwikPen; Inject 30 Units into the skin 2 (two) times daily.  Dispense: 30 mL; Refill: 3 - insulin aspart (novoLOG) injection 6 Units - POCT glucose (manual entry)  2. Hypokalemia Last set of blood work revealed hypokalemia We will check potassium level again today - Basic Metabolic Panel  Meds ordered this encounter  Medications  . insulin isophane & regular human (NOVOLIN 70/30 FLEXPEN RELION) (70-30) 100 UNIT/ML KwikPen    Sig: Inject 30 Units into the skin 2 (two) times daily.    Dispense:  30 mL    Refill:  3    Follow-up: Return in about 1 month (around 06/21/2020) for Diabetes mellitus.       Charlott Rakes, MD, FAAFP. [facility] [place], [place]   05/21/2020, 2:28 PM

## 2020-05-21 NOTE — Patient Instructions (Signed)

## 2020-05-22 LAB — BASIC METABOLIC PANEL
BUN/Creatinine Ratio: 11 (ref 9–20)
BUN: 9 mg/dL (ref 6–24)
CO2: 28 mmol/L (ref 20–29)
Calcium: 9.2 mg/dL (ref 8.7–10.2)
Chloride: 99 mmol/L (ref 96–106)
Creatinine, Ser: 0.81 mg/dL (ref 0.76–1.27)
GFR calc Af Amer: 129 mL/min/{1.73_m2} (ref >59)
GFR calc non Af Amer: 111 mL/min/{1.73_m2} (ref >59)
Glucose: 328 mg/dL — ABNORMAL HIGH (ref 65–99)
Potassium: 3.5 mmol/L (ref 3.5–5.2)
Sodium: 139 mmol/L (ref 134–144)

## 2020-05-24 ENCOUNTER — Telehealth: Payer: Self-pay

## 2020-05-24 NOTE — Telephone Encounter (Signed)
Patient was called and a voicemail was left informing patient to return phone call for lab results. 

## 2020-05-24 NOTE — Telephone Encounter (Signed)
-----   Message from Hoy Register, MD sent at 05/22/2020  1:08 PM EDT ----- Potassium level is normal.  Glucose is elevated.  Please advised to continue with new regimen which was discussed at his last office visit.

## 2020-06-25 ENCOUNTER — Ambulatory Visit: Payer: Self-pay | Admitting: Internal Medicine

## 2021-01-22 ENCOUNTER — Telehealth: Payer: Self-pay | Admitting: Family Medicine

## 2021-01-22 NOTE — Telephone Encounter (Signed)
Copied from CRM [id number]. Topic: General - Other >> Jan 21, 2021  2:50 PM Louis Vargas, Louis Vargas wrote: Reason for CRM: Patient's wife would like to be contacted regarding patient's insulin  Patient has an appt with PCP on 03/26/21 to discuss medications and check their blood sugar  Patient's wife would like to know when/if patient's insulin prescription is able to be refilled  Please contact to advise further

## 2021-01-22 NOTE — Telephone Encounter (Signed)
Please refer pt to mobile unit for sooner appointment last seen by Dr.Newlin on 05/21/2020.

## 2021-02-01 ENCOUNTER — Ambulatory Visit: Payer: Self-pay

## 2021-02-01 ENCOUNTER — Other Ambulatory Visit: Payer: Self-pay | Admitting: Pharmacist

## 2021-02-01 DIAGNOSIS — E1165 Type 2 diabetes mellitus with hyperglycemia: Secondary | ICD-10-CM

## 2021-02-01 MED ORDER — NOVOLIN 70/30 FLEXPEN RELION (70-30) 100 UNIT/ML ~~LOC~~ SUPN: 30.0000 [IU] | PEN_INJECTOR | Freq: Two times a day (BID) | SUBCUTANEOUS | 0 refills | Status: DC

## 2021-02-01 NOTE — Telephone Encounter (Signed)
Pt is requesting courtesy fill of his insulin until May appointment.

## 2021-02-01 NOTE — Telephone Encounter (Signed)
Pt. Reports he has an appointment in May. Has run out of his Novolin 70/30 flexpen this week. Blood glucose running 400-500. Gets it to come down by " drinking water and not eating." Only symptom is "some weakness." Please advise pt.  Answer Assessment - Initial Assessment Questions 1. BLOOD GLUCOSE: "What is your blood glucose level?"      400-500 2. ONSET: "When did you check the blood glucose?"     This week 3. USUAL RANGE: "What is your glucose level usually?" (e.g., usual fasting morning value, usual evening value)     100 4. KETONES: "Do you check for ketones (urine or blood test strips)?" If yes, ask: "What does the test show now?"      No 5. TYPE 1 or 2:  "Do you know what type of diabetes you have?"  (e.g., Type 1, Type 2, Gestational; doesn't know)      Type 1 6. INSULIN: "Do you take insulin?" "What type of insulin(s) do you use? What is the mode of delivery? (syringe, pen; injection or pump)?"      Yes 7. DIABETES PILLS: "Do you take any pills for your diabetes?" If yes, ask: "Have you missed taking any pills recently?"     No pills 8. OTHER SYMPTOMS: "Do you have any symptoms?" (e.g., fever, frequent urination, difficulty breathing, dizziness, weakness, vomiting)     Weak 9. PREGNANCY: "Is there any chance you are pregnant?" "When was your last menstrual period?"     N/a  Protocols used: DIABETES - HIGH BLOOD SUGAR-A-AH

## 2021-02-05 ENCOUNTER — Encounter (HOSPITAL_BASED_OUTPATIENT_CLINIC_OR_DEPARTMENT_OTHER): Payer: Self-pay

## 2021-02-05 ENCOUNTER — Observation Stay (HOSPITAL_BASED_OUTPATIENT_CLINIC_OR_DEPARTMENT_OTHER)
Admission: EM | Admit: 2021-02-05 | Discharge: 2021-02-06 | Disposition: A | Discharge status: Home / Self Care | Payer: BC Managed Care – PPO | Attending: Internal Medicine | Admitting: Internal Medicine

## 2021-02-05 ENCOUNTER — Observation Stay (HOSPITAL_COMMUNITY): Payer: BC Managed Care – PPO

## 2021-02-05 ENCOUNTER — Other Ambulatory Visit: Payer: Self-pay

## 2021-02-05 DIAGNOSIS — E669 Obesity, unspecified: Secondary | ICD-10-CM | POA: Diagnosis present

## 2021-02-05 DIAGNOSIS — E1010 Type 1 diabetes mellitus with ketoacidosis without coma: Secondary | ICD-10-CM | POA: Diagnosis not present

## 2021-02-05 DIAGNOSIS — E1065 Type 1 diabetes mellitus with hyperglycemia: Secondary | ICD-10-CM | POA: Diagnosis not present

## 2021-02-05 DIAGNOSIS — Z20822 Contact with and (suspected) exposure to covid-19: Secondary | ICD-10-CM | POA: Insufficient documentation

## 2021-02-05 DIAGNOSIS — Z8616 Personal history of COVID-19: Secondary | ICD-10-CM | POA: Insufficient documentation

## 2021-02-05 DIAGNOSIS — E1110 Type 2 diabetes mellitus with ketoacidosis without coma: Secondary | ICD-10-CM

## 2021-02-05 DIAGNOSIS — R739 Hyperglycemia, unspecified: Secondary | ICD-10-CM | POA: Diagnosis present

## 2021-02-05 DIAGNOSIS — Z79899 Other long term (current) drug therapy: Secondary | ICD-10-CM | POA: Insufficient documentation

## 2021-02-05 DIAGNOSIS — R109 Unspecified abdominal pain: Secondary | ICD-10-CM

## 2021-02-05 DIAGNOSIS — Z794 Long term (current) use of insulin: Secondary | ICD-10-CM | POA: Diagnosis not present

## 2021-02-05 DIAGNOSIS — E1165 Type 2 diabetes mellitus with hyperglycemia: Secondary | ICD-10-CM

## 2021-02-05 LAB — BLOOD GAS, VENOUS
Acid-base deficit: 6.8 mmol/L — ABNORMAL HIGH (ref 0.0–2.0)
Bicarbonate: 19.7 mmol/L — ABNORMAL LOW (ref 20.0–28.0)
O2 Saturation: 75.4 %
Patient temperature: 98.6
pCO2, Ven: 46.4 mmHg (ref 44.0–60.0)
pH, Ven: 7.251 (ref 7.250–7.430)
pO2, Ven: 41.8 mmHg (ref 32.0–45.0)

## 2021-02-05 LAB — RESP PANEL BY RT-PCR (FLU A&B, COVID) ARPGX2
Influenza A by PCR: NEGATIVE
Influenza B by PCR: NEGATIVE
SARS Coronavirus 2 by RT PCR: NEGATIVE

## 2021-02-05 LAB — URINALYSIS, ROUTINE W REFLEX MICROSCOPIC
Glucose, UA: >=500 mg/dL — AB
Ketones, ur: >80 mg/dL — AB
Leukocytes,Ua: NEGATIVE
Nitrite: NEGATIVE
Protein, ur: 30 mg/dL — AB
Specific Gravity, Urine: >1.03 — ABNORMAL HIGH (ref 1.005–1.030)
pH: 5 (ref 5.0–8.0)

## 2021-02-05 LAB — COMPREHENSIVE METABOLIC PANEL
ALT: 31 U/L (ref 0–44)
AST: 17 U/L (ref 15–41)
Albumin: 3.5 g/dL (ref 3.5–5.0)
Alkaline Phosphatase: 52 U/L (ref 38–126)
Anion gap: 10 (ref 5–15)
BUN: 12 mg/dL (ref 6–20)
CO2: 20 mmol/L — ABNORMAL LOW (ref 22–32)
Calcium: 8.5 mg/dL — ABNORMAL LOW (ref 8.9–10.3)
Chloride: 105 mmol/L (ref 98–111)
Creatinine, Ser: 0.8 mg/dL (ref 0.61–1.24)
GFR, Estimated: >60 mL/min (ref >60)
Glucose, Bld: 165 mg/dL — ABNORMAL HIGH (ref 70–99)
Potassium: 3.1 mmol/L — ABNORMAL LOW (ref 3.5–5.1)
Sodium: 135 mmol/L (ref 135–145)
Total Bilirubin: 1.5 mg/dL — ABNORMAL HIGH (ref 0.3–1.2)
Total Protein: 5.8 g/dL — ABNORMAL LOW (ref 6.5–8.1)

## 2021-02-05 LAB — BASIC METABOLIC PANEL
Anion gap: 15 (ref 5–15)
Anion gap: 9 (ref 5–15)
Anion gap: 6 (ref 5–15)
BUN: 18 mg/dL (ref 6–20)
BUN: 12 mg/dL (ref 6–20)
BUN: 10 mg/dL (ref 6–20)
CO2: 16 mmol/L — ABNORMAL LOW (ref 22–32)
CO2: 20 mmol/L — ABNORMAL LOW (ref 22–32)
CO2: 22 mmol/L (ref 22–32)
Calcium: 9.3 mg/dL (ref 8.9–10.3)
Calcium: 8.6 mg/dL — ABNORMAL LOW (ref 8.9–10.3)
Calcium: 8.8 mg/dL — ABNORMAL LOW (ref 8.9–10.3)
Chloride: 95 mmol/L — ABNORMAL LOW (ref 98–111)
Chloride: 105 mmol/L (ref 98–111)
Chloride: 106 mmol/L (ref 98–111)
Creatinine, Ser: 1.07 mg/dL (ref 0.61–1.24)
Creatinine, Ser: 0.71 mg/dL (ref 0.61–1.24)
Creatinine, Ser: 0.79 mg/dL (ref 0.61–1.24)
GFR, Estimated: >60 mL/min (ref >60)
GFR, Estimated: >60 mL/min (ref >60)
GFR, Estimated: >60 mL/min (ref >60)
Glucose, Bld: 358 mg/dL — ABNORMAL HIGH (ref 70–99)
Glucose, Bld: 151 mg/dL — ABNORMAL HIGH (ref 70–99)
Glucose, Bld: 195 mg/dL — ABNORMAL HIGH (ref 70–99)
Potassium: 3.5 mmol/L (ref 3.5–5.1)
Potassium: 3.3 mmol/L — ABNORMAL LOW (ref 3.5–5.1)
Potassium: 3.4 mmol/L — ABNORMAL LOW (ref 3.5–5.1)
Sodium: 126 mmol/L — ABNORMAL LOW (ref 135–145)
Sodium: 134 mmol/L — ABNORMAL LOW (ref 135–145)
Sodium: 134 mmol/L — ABNORMAL LOW (ref 135–145)

## 2021-02-05 LAB — GLUCOSE, CAPILLARY
Glucose-Capillary: 190 mg/dL — ABNORMAL HIGH (ref 70–99)
Glucose-Capillary: 192 mg/dL — ABNORMAL HIGH (ref 70–99)
Glucose-Capillary: 189 mg/dL — ABNORMAL HIGH (ref 70–99)
Glucose-Capillary: 162 mg/dL — ABNORMAL HIGH (ref 70–99)
Glucose-Capillary: 144 mg/dL — ABNORMAL HIGH (ref 70–99)
Glucose-Capillary: 168 mg/dL — ABNORMAL HIGH (ref 70–99)
Glucose-Capillary: 180 mg/dL — ABNORMAL HIGH (ref 70–99)
Glucose-Capillary: 179 mg/dL — ABNORMAL HIGH (ref 70–99)
Glucose-Capillary: 215 mg/dL — ABNORMAL HIGH (ref 70–99)
Glucose-Capillary: 218 mg/dL — ABNORMAL HIGH (ref 70–99)
Glucose-Capillary: 182 mg/dL — ABNORMAL HIGH (ref 70–99)
Glucose-Capillary: 167 mg/dL — ABNORMAL HIGH (ref 70–99)
Glucose-Capillary: 187 mg/dL — ABNORMAL HIGH (ref 70–99)

## 2021-02-05 LAB — URINALYSIS, MICROSCOPIC (REFLEX)

## 2021-02-05 LAB — HEMOGLOBIN A1C
Hgb A1c MFr Bld: 13.8 % — ABNORMAL HIGH (ref 4.8–5.6)
Mean Plasma Glucose: 349.36 mg/dL

## 2021-02-05 LAB — CBC
HCT: 41.6 % (ref 39.0–52.0)
Hemoglobin: 13.7 g/dL (ref 13.0–17.0)
MCH: 24.2 pg — ABNORMAL LOW (ref 26.0–34.0)
MCHC: 32.9 g/dL (ref 30.0–36.0)
MCV: 73.4 fL — ABNORMAL LOW (ref 80.0–100.0)
Platelets: 238 10*3/uL (ref 150–400)
RBC: 5.67 MIL/uL (ref 4.22–5.81)
RDW: 13.7 % (ref 11.5–15.5)
WBC: 7.2 10*3/uL (ref 4.0–10.5)
nRBC: 0 % (ref 0.0–0.2)

## 2021-02-05 LAB — PHOSPHORUS: Phosphorus: 2.8 mg/dL (ref 2.5–4.6)

## 2021-02-05 LAB — BETA-HYDROXYBUTYRIC ACID
Beta-Hydroxybutyric Acid: 6.65 mmol/L — ABNORMAL HIGH (ref 0.05–0.27)
Beta-Hydroxybutyric Acid: 3.11 mmol/L — ABNORMAL HIGH (ref 0.05–0.27)
Beta-Hydroxybutyric Acid: 2.4 mmol/L — ABNORMAL HIGH (ref 0.05–0.27)

## 2021-02-05 LAB — MRSA PCR SCREENING: MRSA by PCR: NEGATIVE

## 2021-02-05 LAB — CBG MONITORING, ED
Glucose-Capillary: 379 mg/dL — ABNORMAL HIGH (ref 70–99)
Glucose-Capillary: 324 mg/dL — ABNORMAL HIGH (ref 70–99)
Glucose-Capillary: 256 mg/dL — ABNORMAL HIGH (ref 70–99)
Glucose-Capillary: 178 mg/dL — ABNORMAL HIGH (ref 70–99)

## 2021-02-05 LAB — MAGNESIUM: Magnesium: 1.9 mg/dL (ref 1.7–2.4)

## 2021-02-05 MED ORDER — POLYETHYLENE GLYCOL 3350 17 G PO PACK
17.0000 g | PACK | Freq: Every day | ORAL | Status: DC
  Administered 2021-02-05 – 2021-02-06 (×2): 17 g via ORAL
  Filled 2021-02-05 (×2): qty 1

## 2021-02-05 MED ORDER — INSULIN ASPART 100 UNIT/ML ~~LOC~~ SOLN
0.0000 [IU] | SUBCUTANEOUS | Status: DC
  Administered 2021-02-06: 8 [IU] via SUBCUTANEOUS
  Administered 2021-02-06: 2 [IU] via SUBCUTANEOUS
  Administered 2021-02-06: 3 [IU] via SUBCUTANEOUS

## 2021-02-05 MED ORDER — ORAL CARE MOUTH RINSE
15.0000 mL | Freq: Two times a day (BID) | OROMUCOSAL | Status: DC
  Administered 2021-02-05 – 2021-02-06 (×2): 15 mL via OROMUCOSAL
  Filled 2021-02-05: qty 15

## 2021-02-05 MED ORDER — ENOXAPARIN SODIUM 60 MG/0.6ML ~~LOC~~ SOLN
60.0000 mg | SUBCUTANEOUS | Status: DC
  Administered 2021-02-05 – 2021-02-06 (×2): 60 mg via SUBCUTANEOUS
  Filled 2021-02-05 (×2): qty 0.6

## 2021-02-05 MED ORDER — LACTATED RINGERS IV SOLN: INTRAVENOUS | Status: DC

## 2021-02-05 MED ORDER — ONDANSETRON HCL 4 MG PO TABS: 4.0000 mg | ORAL_TABLET | Freq: Four times a day (QID) | ORAL | Status: DC | PRN

## 2021-02-05 MED ORDER — LACTATED RINGERS IV SOLN: INTRAVENOUS | Status: AC

## 2021-02-05 MED ORDER — SENNOSIDES-DOCUSATE SODIUM 8.6-50 MG PO TABS
1.0000 | ORAL_TABLET | Freq: Every day | ORAL | Status: DC
  Administered 2021-02-05 – 2021-02-06 (×2): 1 via ORAL
  Filled 2021-02-05 (×2): qty 1

## 2021-02-05 MED ORDER — ONDANSETRON HCL 4 MG/2ML IJ SOLN: 4.0000 mg | Freq: Four times a day (QID) | INTRAMUSCULAR | Status: DC | PRN

## 2021-02-05 MED ORDER — POTASSIUM CHLORIDE CRYS ER 20 MEQ PO TBCR
20.0000 meq | EXTENDED_RELEASE_TABLET | Freq: Once | ORAL | Status: AC
  Administered 2021-02-05: 20 meq via ORAL
  Filled 2021-02-05: qty 1

## 2021-02-05 MED ORDER — LACTATED RINGERS IV BOLUS
20.0000 mL/kg | Freq: Once | INTRAVENOUS | Status: AC
  Administered 2021-02-05: 2258 mL via INTRAVENOUS

## 2021-02-05 MED ORDER — ACETAMINOPHEN 325 MG PO TABS: 650.0000 mg | ORAL_TABLET | Freq: Four times a day (QID) | ORAL | Status: DC | PRN

## 2021-02-05 MED ORDER — DEXTROSE IN LACTATED RINGERS 5 % IV SOLN: INTRAVENOUS | Status: DC

## 2021-02-05 MED ORDER — POTASSIUM CHLORIDE 10 MEQ/100ML IV SOLN
10.0000 meq | INTRAVENOUS | Status: AC
  Administered 2021-02-05 (×2): 10 meq via INTRAVENOUS
  Filled 2021-02-05 (×2): qty 100

## 2021-02-05 MED ORDER — ONDANSETRON 4 MG PO TBDP
4.0000 mg | ORAL_TABLET | Freq: Once | ORAL | Status: AC | PRN
  Administered 2021-02-05: 4 mg via ORAL
  Filled 2021-02-05: qty 1

## 2021-02-05 MED ORDER — SODIUM CHLORIDE 0.9% FLUSH
3.0000 mL | Freq: Two times a day (BID) | INTRAVENOUS | Status: DC
  Administered 2021-02-05 – 2021-02-06 (×3): 3 mL via INTRAVENOUS

## 2021-02-05 MED ORDER — METOPROLOL TARTRATE 5 MG/5ML IV SOLN
5.0000 mg | Freq: Four times a day (QID) | INTRAVENOUS | Status: DC | PRN
Start: 2021-02-05 — End: 2021-02-06

## 2021-02-05 MED ORDER — INSULIN REGULAR(HUMAN) IN NACL 100-0.9 UT/100ML-% IV SOLN
INTRAVENOUS | Status: DC
Start: 2021-02-05 — End: 2021-02-06
  Administered 2021-02-05: 19 [IU]/h via INTRAVENOUS
  Administered 2021-02-05: 7 [IU]/h via INTRAVENOUS
  Filled 2021-02-05 (×2): qty 100

## 2021-02-05 MED ORDER — DEXTROSE 50 % IV SOLN: 0.0000 mL | INTRAVENOUS | Status: DC | PRN

## 2021-02-05 MED ORDER — BISACODYL 10 MG RE SUPP: 10.0000 mg | Freq: Every day | RECTAL | Status: DC | PRN

## 2021-02-05 MED ORDER — INSULIN ASPART PROT & ASPART (70-30 MIX) 100 UNIT/ML ~~LOC~~ SUSP
25.0000 [IU] | Freq: Two times a day (BID) | SUBCUTANEOUS | Status: DC
  Administered 2021-02-06 (×2): 25 [IU] via SUBCUTANEOUS
  Filled 2021-02-05: qty 10

## 2021-02-05 MED ORDER — POTASSIUM CHLORIDE CRYS ER 20 MEQ PO TBCR
40.0000 meq | EXTENDED_RELEASE_TABLET | Freq: Once | ORAL | Status: AC
  Administered 2021-02-05: 40 meq via ORAL
  Filled 2021-02-05: qty 2

## 2021-02-05 MED ORDER — CHLORHEXIDINE GLUCONATE CLOTH 2 % EX PADS
6.0000 | MEDICATED_PAD | Freq: Every day | CUTANEOUS | Status: DC
  Administered 2021-02-05 – 2021-02-06 (×2): 6 via TOPICAL
  Filled 2021-02-05: qty 6

## 2021-02-05 MED ORDER — ACETAMINOPHEN 650 MG RE SUPP: 650.0000 mg | Freq: Four times a day (QID) | RECTAL | Status: DC | PRN

## 2021-02-05 NOTE — Progress Notes (Signed)
RN paged [facility] Admits [facility] Consults System-wide for attending information.

## 2021-02-05 NOTE — ED Triage Notes (Signed)
Pt presents with complaints of hyperglycemia over last 3 days. Says he is compliant with his insulin. Also reports intermittent vomiting episodes x 1 week.

## 2021-02-05 NOTE — Progress Notes (Signed)
Inpatient Diabetes Program Recommendations  AACE/ADA: New Consensus Statement on Inpatient Glycemic Control (2015)  Target Ranges:  Prepandial:   less than 140 mg/dL      Peak postprandial:   less than 180 mg/dL (1-2 hours)      Critically ill patients:  140 - 180 mg/dL   Lab Results  Component Value Date   GLUCAP 168 (H) 02/05/2021   HGBA1C 13.8 (H) 02/05/2021    Review of Glycemic Control  Diabetes history: DM1 Outpatient Diabetes medications: 70/30 34 units BID, Novolin R 0-15 units TID Current orders for Inpatient glycemic control: IV insulin per EndoTool for DKA  AG - 9 CO2 - 20 Beta-Hydroxy - 3.11  CHO mod diet to begin at dinner tonight  Inpatient Diabetes Program Recommendations:     For transitioning off drip:  70/30 25 units BID Novolog 0-15 units Q4 x 12H, then TID and 0-5 HS Titrate 70/30 if CBGs > 180 mg/dL.  Spoke with pt at bedside regarding his diabetes control at home. Pt states his blood sugars are always high, checks blood sugars 2-3x/day. Ran out of insulin a day ago, d/t insulin not being at pharmacy when he went to pick it up. States he misses doses of 70/30 when he skips a meal. We discussed importance of eating healthy, not skipping meals, and watching quantities of mac and cheese, starches. Pt said he drinks sweet tea and juice ("I thought juice was good for me") Has not been focused on controlling his blood sugars. Long discussion about HgbA1C of 13.8% and goal of 7%, to reduce risk of complications. Discussed complications that can arise from poor glucose control. Sees PCP at [facility], although states he hasn't seen anyone in about a year. States when he gives his insulin, he sees the insulin on his skin. Not leaving needle in long enough. Encouraged him to insert needle in belly, then push white button, and COUNT TO TEN. He said he just gives it a second and then takes it out. Multiple reasons for high blood sugars. Needs OP Diabetes Education consult. Will  order.  F/U on 4/6. Discussed with RN to put full meal in EndoTool after pt eats.   Thank you. Lorenda Peck, RD, LDN, CDE Inpatient Diabetes Coordinator [phone]

## 2021-02-05 NOTE — H&P (Signed)
History and Physical   Jash Wahlen RAX:[id number] DOB: 12/29/1980 DOA: 02/05/2021  Referring MD/NP/PA: Dr. Wyvonnia Dusky PCP: Charlott Rakes, MD  Patient coming from: Home by way of [facility]  Chief Complaint: Hyperglycemia, vomiting  HPI: Tyson Parkison is a 40 y.o. male with a history of T1DM who presented to [facility] ED 4/5 with 4-5 days of worsening hyperglycemia into 400-500's associated with nausea and vomiting that has precluded oral intake for several days. He ran out of insulin for 1 day but has otherwise been taking novolin 70/30 44u BID + novolin regular SSI.    ED Course: Afebrile, VSS, hyperglycemic with anion gap and acidosis with ketonuria. VBG pH 7.07. Admission requested for DKA, transferred to [facility].   On arrival to [facility] he is somnolent but rousable. Reports improvement in diffuse abdominal pain and nausea better with medications. Not hungry, no vomiting since arrival.   Review of Systems: Denies fever, chills, headache, cough, sore throat, chest pain, palpitations, shortness of breath, blood in stool, change in bladder habits, myalgias, arthralgias, rash, and per HPI. All others reviewed and are negative.   Past Medical History:  Diagnosis Date  . Diabetes mellitus without complication ([place])   . Obesity (BMI 30.0-34.9)    History reviewed. No pertinent surgical history. - Nonsmoker  reports that he has never smoked. He has never used smokeless tobacco. He reports current alcohol use. He reports that he does not use drugs. Allergies  Allergen Reactions  . Iodinated Diagnostic Agents Swelling   Family History  Problem Relation Age of Onset  . Diabetes Mother   . Diabetes Sister   . Diabetes Brother    - Family history otherwise reviewed and not pertinent.  Prior to Admission medications   Medication Sig Start Date End Date Taking? Authorizing Provider  atorvastatin (LIPITOR) 40 MG tablet Take 1 tablet (40 mg total) by mouth daily. Patient not taking: Reported on 05/15/2020 03/07/20  05/06/20  Shelly Coss, MD  atorvastatin (LIPITOR) 40 MG tablet Take 1 tablet (40 mg total) by mouth daily. 05/17/20 07/16/20  Antonieta Pert, MD  blood glucose meter kit and supplies Dispense based on patient and insurance preference. Use up to four times daily as directed. (FOR ICD-10 E10.9, E11.9). 03/07/20   Shelly Coss, MD  fenofibrate 160 MG tablet Take 1 tablet (160 mg total) by mouth daily. 05/17/20   Antonieta Pert, MD  insulin isophane & regular human (NOVOLIN 70/30 FLEXPEN RELION) (70-30) 100 UNIT/ML KwikPen Inject 30 Units into the skin 2 (two) times daily. 02/01/21 05/12/21  Charlott Rakes, MD  Insulin Pen Needle (PEN NEEDLES 3/16") 31G X 5 MM MISC 1 each by Does not apply route in the morning, at noon, and at bedtime. 05/17/20   Antonieta Pert, MD  Insulin Regular Human (NOVOLIN R FLEXPEN RELION) 100 UNIT/ML SOPN Inject 0-15 Units as directed in the morning, at noon, and at bedtime. 05/17/20   Antonieta Pert, MD  potassium chloride SA (KLOR-CON) 20 MEQ tablet Take 1 tablet (20 mEq total) by mouth daily for 7 days. 05/18/20 05/25/20  Antonieta Pert, MD    Physical Exam: Vitals:   02/05/21 0108 02/05/21 0423 02/05/21 0745 02/05/21 0842  BP:  (!) 139/91 127/66 (!) 145/76  Pulse:  86 77 75  Resp:  20 (!) 22 14  Temp:      TempSrc:      SpO2:  99% 96% 98%  Weight: 112.9 kg     Height: 6' (1.829 m)      Constitutional: 39  y.o. male in no distress, appears fatigued Eyes: Lids and conjunctivae normal, PERRL ENMT: Mucous membranes are moist. Posterior pharynx clear of any exudate or lesions. Fair dentition.  Neck: normal, supple, no masses, no thyromegaly Respiratory: Non-labored breathing room air without accessory muscle use. Clear breath sounds to auscultation bilaterally Cardiovascular: Regular rate and rhythm, no murmurs, rubs, or gallops. No carotid bruits. No JVD. No LE edema. Palpable pedal pulses. Abdomen: Hypoactive bowel sounds. No tenderness, slight distention, and no masses palpated. No  hepatosplenomegaly. GU: No indwelling catheter Musculoskeletal: No clubbing / cyanosis. No joint deformity upper and lower extremities. Good ROM, no contractures. Normal muscle tone.  Skin: Warm, dry. No rashes, wounds, or ulcers on visualized skin. Neurologic: CN II-XII grossly intact. No dysarthria. Somnolence limits evaluation but no focal deficits noted.  Psychiatric: Drowsy, rousable.    Labs on Admission: I have personally reviewed following labs and imaging studies  CBC: Recent Labs  Lab 02/05/21 0112  WBC 7.2  HGB 13.7  HCT 41.6  MCV 73.4*  PLT 939   Basic Metabolic Panel: Recent Labs  Lab 02/05/21 0112  NA 126*  K 3.5  CL 95*  CO2 16*  GLUCOSE 358*  BUN 18  CREATININE 1.07  CALCIUM 9.3   GFR: Estimated Creatinine Clearance: 119 mL/min (by C-G formula based on SCr of 1.07 mg/dL). Liver Function Tests: No results for input(s): AST, ALT, ALKPHOS, BILITOT, PROT, ALBUMIN in the last 168 hours. No results for input(s): LIPASE, AMYLASE in the last 168 hours. No results for input(s): AMMONIA in the last 168 hours. Coagulation Profile: No results for input(s): INR, PROTIME in the last 168 hours. Cardiac Enzymes: No results for input(s): CKTOTAL, CKMB, CKMBINDEX, TROPONINI in the last 168 hours. BNP (last 3 results) No results for input(s): PROBNP in the last 8760 hours. HbA1C: No results for input(s): HGBA1C in the last 72 hours. CBG: Recent Labs  Lab 02/05/21 0107 02/05/21 0527 02/05/21 0635 02/05/21 0732 02/05/21 0856  GLUCAP 379* 324* 256* 178* 190*   Lipid Profile: No results for input(s): CHOL, HDL, LDLCALC, TRIG, CHOLHDL, LDLDIRECT in the last 72 hours. Thyroid Function Tests: No results for input(s): TSH, T4TOTAL, FREET4, T3FREE, THYROIDAB in the last 72 hours. Anemia Panel: No results for input(s): VITAMINB12, FOLATE, FERRITIN, TIBC, IRON, RETICCTPCT in the last 72 hours. Urine analysis:    Component Value Date/Time   COLORURINE YELLOW  02/05/2021 0112   APPEARANCEUR CLEAR 02/05/2021 0112   LABSPEC >1.030 (H) 02/05/2021 0112   PHURINE 5.0 02/05/2021 0112   GLUCOSEU >=500 (A) 02/05/2021 0112   HGBUR TRACE (A) 02/05/2021 0112   BILIRUBINUR SMALL (A) 02/05/2021 0112   KETONESUR >80 (A) 02/05/2021 0112   PROTEINUR 30 (A) 02/05/2021 0112   NITRITE NEGATIVE 02/05/2021 0112   LEUKOCYTESUR NEGATIVE 02/05/2021 0112    Recent Results (from the past 240 hour(s))  Resp Panel by RT-PCR (Flu A&B, Covid) Nasopharyngeal Swab     Status: None   Collection Time: 02/05/21  5:41 AM   Specimen: Nasopharyngeal Swab; Nasopharyngeal(NP) swabs in vial transport medium  Result Value Ref Range Status   SARS Coronavirus 2 by RT PCR NEGATIVE NEGATIVE Final    Comment: (NOTE) SARS-CoV-2 target nucleic acids are NOT DETECTED.  The SARS-CoV-2 RNA is generally detectable in upper respiratory specimens during the acute phase of infection. The lowest concentration of SARS-CoV-2 viral copies this assay can detect is 138 copies/mL. A negative result does not preclude SARS-Cov-2 infection and should not be used as the sole  basis for treatment or other patient management decisions. A negative result may occur with  improper specimen collection/handling, submission of specimen other than nasopharyngeal swab, presence of viral mutation(s) within the areas targeted by this assay, and inadequate number of viral copies(<138 copies/mL). A negative result must be combined with clinical observations, patient history, and epidemiological information. The expected result is Negative.  Fact Sheet for Patients:  EntrepreneurPulse.com.au  Fact Sheet for Healthcare Providers:  IncredibleEmployment.be  This test is no t yet approved or cleared by the [place] FDA and  has been authorized for detection and/or diagnosis of SARS-CoV-2 by FDA under an Emergency Use Authorization (EUA). This EUA will remain  in effect  (meaning this test can be used) for the duration of the COVID-19 declaration under Section 564(b)(1) of the Act, 21 U.S.C.section 360bbb-3(b)(1), unless the authorization is terminated  or revoked sooner.       Influenza A by PCR NEGATIVE NEGATIVE Final   Influenza B by PCR NEGATIVE NEGATIVE Final    Comment: (NOTE) The Xpert Xpress SARS-CoV-2/FLU/RSV plus assay is intended as an aid in the diagnosis of influenza from Nasopharyngeal swab specimens and should not be used as a sole basis for treatment. Nasal washings and aspirates are unacceptable for Xpert Xpress SARS-CoV-2/FLU/RSV testing.  Fact Sheet for Patients: EntrepreneurPulse.com.au  Fact Sheet for Healthcare Providers: IncredibleEmployment.be  This test is not yet approved or cleared by the [place] FDA and has been authorized for detection and/or diagnosis of SARS-CoV-2 by FDA under an Emergency Use Authorization (EUA). This EUA will remain in effect (meaning this test can be used) for the duration of the COVID-19 declaration under Section 564(b)(1) of the Act, 21 U.S.C. section 360bbb-3(b)(1), unless the authorization is terminated or revoked.  Performed at [facility], [address] [place], [place] [address]     Assessment/Plan Active Problems:   DKA, type 1 ([place])   DKA in IDDM: Unclear precipitant. Previous notes state he no-showed endocrinology multiple times. LastHbA1c 15.8%.   - Insulin gtt, IVF's as ordered, keep NPO while somnolent.  - CBGs improved, will recheck metabolic panel now and serially.  - Recheck VBG to confirm we're going the right direction with acidosis.  - Update HbA1c - Mentating adequately to maintain airway, remain in SDU - Diabetes coordinator consulted  Pseudohyponatremia:  - Correct hyperglycemia, continue IVF  Abdominal pain:  - Check abd XR  Hypertriglyceridemia:  - Statin on MAR, will await reconciliation and  LFTs.  Obesity: Body mass index is 33.77 kg/m.   DVT prophylaxis: Lovenox  Code Status: Full  Family Communication: None at bedside Disposition Plan: Home Consults called: None  Admission status: Observation. May be stabilized to the point of safe discharge within the next 24-48 hrs.     Patrecia Pour, MD [facility] [email or web address] 02/05/2021, 9:34 AM

## 2021-02-05 NOTE — ED Notes (Signed)
FSBG 178, per indo tool, started D5 LR and titrated insulin to 3.6units/hr.

## 2021-02-05 NOTE — ED Notes (Signed)
Pt leaves with [facility].  Report given by previous RN

## 2021-02-05 NOTE — ED Provider Notes (Signed)
[facility] EMERGENCY DEPARTMENT Provider Note   CSN: [id number] Arrival date & time: 02/05/21  0100     History Chief Complaint  Patient presents with  . Hyperglycemia    Louis Vargas is a 40 y.o. male.  Patient history of diabetes here with hyperglycemia over the past 4 to 5 days.  States his sugars have been difficult control and has been in the 4-500 range despite compliance with his insulin regimen.  He states he uses 44 units of Novolin twice daily as well as a sliding scale with meals.  He was out of his insulin for 1 day over the weekend but is back on it now.  He states every time he tries to eat he vomits and has abdominal pain is unable to keep anything down. He denies any diarrhea.  Denies any pain with urination or blood in the urine.  Denies any fever.  Denies any chest pain or shortness of breath. States he missed 1 day of his insulin regimen but is back on it now.  The history is provided by the patient.  Hyperglycemia Associated symptoms: abdominal pain, fatigue, nausea, vomiting and weakness   Associated symptoms: no chest pain, no dysuria, no fever and no shortness of breath        Past Medical History:  Diagnosis Date  . Diabetes mellitus without complication ([place])   . Obesity (BMI 30.0-34.9)     Patient Active Problem List   Diagnosis Date Noted  . DKA (diabetic ketoacidoses) 03/04/2020  . Obesity (BMI 30-39.9) 03/04/2020  . Acute kidney injury ([place]) 03/04/2020  . Hyperkalemia 03/04/2020  . Lab test positive for detection of COVID-19 virus 03/04/2020    History reviewed. No pertinent surgical history.     Family History  Problem Relation Age of Onset  . Diabetes Mother   . Diabetes Sister   . Diabetes Brother     Social History   Tobacco Use  . Smoking status: Never Smoker  . Smokeless tobacco: Never Used  Vaping Use  . Vaping Use: Never used  Substance Use Topics  . Alcohol use: Yes    Comment: occ  . Drug use: No     Home Medications Prior to Admission medications   Medication Sig Start Date End Date Taking? Authorizing Provider  atorvastatin (LIPITOR) 40 MG tablet Take 1 tablet (40 mg total) by mouth daily. Patient not taking: Reported on 05/15/2020 03/07/20 05/06/20  Shelly Coss, MD  atorvastatin (LIPITOR) 40 MG tablet Take 1 tablet (40 mg total) by mouth daily. 05/17/20 07/16/20  Antonieta Pert, MD  blood glucose meter kit and supplies Dispense based on patient and insurance preference. Use up to four times daily as directed. (FOR ICD-10 E10.9, E11.9). 03/07/20   Shelly Coss, MD  fenofibrate 160 MG tablet Take 1 tablet (160 mg total) by mouth daily. 05/17/20   Antonieta Pert, MD  insulin isophane & regular human (NOVOLIN 70/30 FLEXPEN RELION) (70-30) 100 UNIT/ML KwikPen Inject 30 Units into the skin 2 (two) times daily. 02/01/21 05/12/21  Charlott Rakes, MD  Insulin Pen Needle (PEN NEEDLES 3/16") 31G X 5 MM MISC 1 each by Does not apply route in the morning, at noon, and at bedtime. 05/17/20   Antonieta Pert, MD  Insulin Regular Human (NOVOLIN R FLEXPEN RELION) 100 UNIT/ML SOPN Inject 0-15 Units as directed in the morning, at noon, and at bedtime. 05/17/20   Antonieta Pert, MD  potassium chloride SA (KLOR-CON) 20 MEQ tablet Take 1 tablet (20 mEq  total) by mouth daily for 7 days. 05/18/20 05/25/20  Antonieta Pert, MD    Allergies    Iodinated diagnostic agents  Review of Systems   Review of Systems  Constitutional: Positive for activity change, appetite change and fatigue. Negative for fever.  HENT: Negative for congestion and rhinorrhea.   Respiratory: Negative for cough, chest tightness and shortness of breath.   Cardiovascular: Negative for chest pain.  Gastrointestinal: Positive for abdominal pain, nausea and vomiting.  Genitourinary: Negative for dysuria and hematuria.  Musculoskeletal: Negative for arthralgias and myalgias.  Neurological: Positive for weakness. Negative for headaches.   all other systems are  negative except as noted in the HPI and PMH. \   Physical Exam Updated Vital Signs BP (!) 139/91   Pulse 86   Temp 98.8 F (37.1 C) (Oral)   Resp 20   Ht 6' (1.829 m)   Wt 112.9 kg   SpO2 99%   BMI 33.77 kg/m   Physical Exam Vitals and nursing note reviewed.  Constitutional:      General: He is not in acute distress.    Appearance: He is well-developed.  HENT:     Head: Normocephalic and atraumatic.     Mouth/Throat:     Mouth: Mucous membranes are dry.     Pharynx: No oropharyngeal exudate.  Eyes:     Conjunctiva/sclera: Conjunctivae normal.     Pupils: Pupils are equal, round, and reactive to light.  Neck:     Comments: No meningismus. Cardiovascular:     Rate and Rhythm: Normal rate and regular rhythm.     Heart sounds: Normal heart sounds. No murmur heard.   Pulmonary:     Effort: Pulmonary effort is normal. No respiratory distress.     Breath sounds: Normal breath sounds.  Abdominal:     Palpations: Abdomen is soft.     Tenderness: There is abdominal tenderness. There is no guarding or rebound.     Comments: Mild diffuse tenderness  Musculoskeletal:        General: No tenderness. Normal range of motion.     Cervical back: Normal range of motion and neck supple.  Skin:    General: Skin is warm.  Neurological:     Mental Status: He is alert and oriented to person, place, and time.     Cranial Nerves: No cranial nerve deficit.     Motor: No abnormal muscle tone.     Coordination: Coordination normal.     Comments: No ataxia on finger to nose bilaterally. No pronator drift. 5/5 strength throughout. CN 2-12 intact.Equal grip strength. Sensation intact.   Psychiatric:        Behavior: Behavior normal.     ED Results / Procedures / Treatments   Labs (all labs ordered are listed, but only abnormal results are displayed) Labs Reviewed  BASIC METABOLIC PANEL - Abnormal; Notable for the following components:      Result Value   Sodium 126 (*)    Chloride 95  (*)    CO2 16 (*)    Glucose, Bld 358 (*)    All other components within normal limits  CBC - Abnormal; Notable for the following components:   MCV 73.4 (*)    MCH 24.2 (*)    All other components within normal limits  URINALYSIS, ROUTINE W REFLEX MICROSCOPIC - Abnormal; Notable for the following components:   Specific Gravity, Urine >1.030 (*)    Glucose, UA >=500 (*)    Hgb urine dipstick  TRACE (*)    Bilirubin Urine SMALL (*)    Ketones, ur >80 (*)    Protein, ur 30 (*)    All other components within normal limits  URINALYSIS, MICROSCOPIC (REFLEX) - Abnormal; Notable for the following components:   Bacteria, UA RARE (*)    All other components within normal limits  CBG MONITORING, ED - Abnormal; Notable for the following components:   Glucose-Capillary 379 (*)    All other components within normal limits  BETA-HYDROXYBUTYRIC ACID  BETA-HYDROXYBUTYRIC ACID  BETA-HYDROXYBUTYRIC ACID  CBG MONITORING, ED  CBG MONITORING, ED  CBG MONITORING, ED  I-STAT VENOUS BLOOD GAS, ED    EKG None  Radiology No results found.  Procedures .Critical Care Performed by: Ezequiel Essex, MD Authorized by: Ezequiel Essex, MD   Critical care provider statement:    Critical care time (minutes):  45   Critical care was necessary to treat or prevent imminent or life-threatening deterioration of the following conditions:  Dehydration and endocrine crisis   Critical care was time spent personally by me on the following activities:  Discussions with consultants, evaluation of patient's response to treatment, examination of patient, ordering and performing treatments and interventions, ordering and review of laboratory studies, ordering and review of radiographic studies, pulse oximetry, re-evaluation of patient's condition, obtaining history from patient or surrogate and review of old charts     Medications Ordered in ED Medications  lactated ringers bolus 2,258 mL (has no administration in  time range)  insulin regular, human (MYXREDLIN) 100 units/ 100 mL infusion (has no administration in time range)  lactated ringers infusion (has no administration in time range)  dextrose 5 % in lactated ringers infusion (has no administration in time range)  dextrose 50 % solution 0-50 mL (has no administration in time range)  potassium chloride 10 mEq in 100 mL IVPB (has no administration in time range)  ondansetron (ZOFRAN-ODT) disintegrating tablet 4 mg (4 mg Oral Given 02/05/21 0206)    ED Course  I have reviewed the triage vital signs and the nursing notes.  Pertinent labs & imaging results that were available during my care of the patient were reviewed by me and considered in my medical decision making (see chart for details).    MDM Rules/Calculators/A&P                         Hyperglycemia with abdominal pain, nausea and vomiting.  Vitals are stable.  Abdomen is soft without peritoneal signs.  Labs in triage show hyperglycemia with low bicarb of 16, anion gap 15, large ketones in urine.  Sodium of 126.  Will obtain VBG.  Concern for DKA Ph 7.07  With bicarb <15, not crossing into Epic.  Repeat BMET.  Patient in DKA. Aggressive IVF, insulin gtt, KCl replacement.   Admission d/w Dr. Hal Hope. Airway and mental status stable throughout ED stay. Final Clinical Impression(s) / ED Diagnoses Final diagnoses:  Diabetic ketoacidosis without coma associated with type 2 diabetes mellitus [facility])    Rx / DC Orders ED Discharge Orders    None       Mazell Aylesworth, Annie Main, MD 02/05/21 [phone]

## 2021-02-05 NOTE — ED Notes (Signed)
Report from Hang, RN  

## 2021-02-05 NOTE — ED Notes (Signed)
Report to [facility] with [facility]

## 2021-02-06 ENCOUNTER — Other Ambulatory Visit: Payer: Self-pay

## 2021-02-06 ENCOUNTER — Encounter (HOSPITAL_COMMUNITY): Payer: Self-pay

## 2021-02-06 DIAGNOSIS — E669 Obesity, unspecified: Secondary | ICD-10-CM

## 2021-02-06 DIAGNOSIS — E1010 Type 1 diabetes mellitus with ketoacidosis without coma: Secondary | ICD-10-CM

## 2021-02-06 LAB — CBC WITH DIFFERENTIAL/PLATELET
Abs Immature Granulocytes: 0.07 10*3/uL (ref 0.00–0.07)
Basophils Absolute: 0 10*3/uL (ref 0.0–0.1)
Basophils Relative: 0 %
Eosinophils Absolute: 0.1 10*3/uL (ref 0.0–0.5)
Eosinophils Relative: 2 %
HCT: 34.2 % — ABNORMAL LOW (ref 39.0–52.0)
Hemoglobin: 10.7 g/dL — ABNORMAL LOW (ref 13.0–17.0)
Immature Granulocytes: 2 %
Lymphocytes Relative: 44 %
Lymphs Abs: 1.7 10*3/uL (ref 0.7–4.0)
MCH: 23.6 pg — ABNORMAL LOW (ref 26.0–34.0)
MCHC: 31.3 g/dL (ref 30.0–36.0)
MCV: 75.3 fL — ABNORMAL LOW (ref 80.0–100.0)
Monocytes Absolute: 0.4 10*3/uL (ref 0.1–1.0)
Monocytes Relative: 11 %
Neutro Abs: 1.6 10*3/uL — ABNORMAL LOW (ref 1.7–7.7)
Neutrophils Relative %: 41 %
Platelets: 197 10*3/uL (ref 150–400)
RBC: 4.54 MIL/uL (ref 4.22–5.81)
RDW: 13.9 % (ref 11.5–15.5)
WBC: 3.8 10*3/uL — ABNORMAL LOW (ref 4.0–10.5)
nRBC: 0 % (ref 0.0–0.2)

## 2021-02-06 LAB — GLUCOSE, CAPILLARY
Glucose-Capillary: 133 mg/dL — ABNORMAL HIGH (ref 70–99)
Glucose-Capillary: 136 mg/dL — ABNORMAL HIGH (ref 70–99)
Glucose-Capillary: 151 mg/dL — ABNORMAL HIGH (ref 70–99)
Glucose-Capillary: 161 mg/dL — ABNORMAL HIGH (ref 70–99)
Glucose-Capillary: 205 mg/dL — ABNORMAL HIGH (ref 70–99)
Glucose-Capillary: 251 mg/dL — ABNORMAL HIGH (ref 70–99)

## 2021-02-06 LAB — BASIC METABOLIC PANEL
Anion gap: 9 (ref 5–15)
BUN: 10 mg/dL (ref 6–20)
CO2: 24 mmol/L (ref 22–32)
Calcium: 8.9 mg/dL (ref 8.9–10.3)
Chloride: 107 mmol/L (ref 98–111)
Creatinine, Ser: 0.85 mg/dL (ref 0.61–1.24)
GFR, Estimated: >60 mL/min (ref >60)
Glucose, Bld: 117 mg/dL — ABNORMAL HIGH (ref 70–99)
Potassium: 3.1 mmol/L — ABNORMAL LOW (ref 3.5–5.1)
Sodium: 140 mmol/L (ref 135–145)

## 2021-02-06 MED ORDER — SENNOSIDES-DOCUSATE SODIUM 8.6-50 MG PO TABS
1.0000 | ORAL_TABLET | Freq: Every day | ORAL | 0 refills | Status: AC
  Filled 2021-02-06: qty 30, 30d supply, fill #0

## 2021-02-06 MED ORDER — NOVOLIN 70/30 FLEXPEN RELION (70-30) 100 UNIT/ML ~~LOC~~ SUPN: 30.0000 [IU] | PEN_INJECTOR | Freq: Two times a day (BID) | SUBCUTANEOUS | 0 refills | Status: DC

## 2021-02-06 MED ORDER — POTASSIUM CHLORIDE CRYS ER 20 MEQ PO TBCR
40.0000 meq | EXTENDED_RELEASE_TABLET | Freq: Once | ORAL | Status: AC
  Administered 2021-02-06: 40 meq via ORAL
  Filled 2021-02-06: qty 2

## 2021-02-06 MED ORDER — ONDANSETRON HCL 4 MG PO TABS
4.0000 mg | ORAL_TABLET | Freq: Four times a day (QID) | ORAL | 0 refills | Status: DC | PRN
  Filled 2021-02-06: qty 20, 5d supply, fill #0

## 2021-02-06 MED ORDER — POTASSIUM CHLORIDE 10 MEQ/100ML IV SOLN
10.0000 meq | Freq: Once | INTRAVENOUS | Status: AC
  Administered 2021-02-06: 10 meq via INTRAVENOUS
  Filled 2021-02-06: qty 100

## 2021-02-06 MED ORDER — POTASSIUM CHLORIDE CRYS ER 20 MEQ PO TBCR
40.0000 meq | EXTENDED_RELEASE_TABLET | Freq: Two times a day (BID) | ORAL | Status: DC
  Administered 2021-02-06: 40 meq via ORAL
  Filled 2021-02-06: qty 2

## 2021-02-06 MED ORDER — POLYETHYLENE GLYCOL 3350 17 G PO PACK
17.0000 g | PACK | Freq: Every day | ORAL | 0 refills | Status: AC
  Filled 2021-02-06: qty 14, 14d supply, fill #0

## 2021-02-06 MED ORDER — "PEN NEEDLES 3/16"" 31G X 5 MM MISC"
1.0000 | Freq: Three times a day (TID) | 0 refills | Status: DC
  Filled 2021-02-06: qty 100, 33d supply, fill #0

## 2021-02-06 NOTE — Discharge Summary (Signed)
Physician Discharge Summary  Dj Senteno ATF:[id number] DOB: Aug 25, 1981 DOA: 02/05/2021  PCP: Charlott Rakes, MD  Admit date: 02/05/2021 Discharge date: 02/06/2021  Admitted From: Home Disposition: Home  Recommendations for Outpatient Follow-up:  1. Follow up with PCP in 1-2 weeks 2. Follow up with the Diabetes Education Coordinator as an outpatient  3. Please obtain CMP/CBC, Mag, Phos in one week 4. Please follow up on the following pending results:  Home Health: No  Equipment/Devices: None    Discharge Condition: Stable CODE STATUS: FULL CODE  Diet recommendation: Heart Healthy Carb Modified Diet   Brief/Interim Summary: HPI Per Dr. Vance Gather on 02/05/21  HPI: Melquisedec Journey is a 40 y.o. male with a history of T1DM who presented to [facility] ED 4/5 with 4-5 days of worsening hyperglycemia into 400-500's associated with nausea and vomiting that has precluded oral intake for several days. He ran out of insulin for 1 day but has otherwise been taking novolin 70/30 44u BID + novolin regular SSI.    ED Course: Afebrile, VSS, hyperglycemic with anion gap and acidosis with ketonuria. VBG pH 7.07. Admission requested for DKA, transferred to [facility].   On arrival to [facility] he is somnolent but rousable. Reports improvement in diffuse abdominal pain and nausea better with medications. Not hungry, no vomiting since arrival.   **Interim History He improved and was transitioned off of his insulin drip to long-acting insulin and no longer nauseous and blood sugars remain controlled.  Diabetes education coordinator evaluated the patient and made recommendations of sending the patient back on his Novolin 70/30 30 units twice daily as well as a Novolin R sliding scale 0 to 15 units.  Patient was deemed stable to be discharged as his gap closed and his DKA resolved.  He was advised to follow-up with PCP as well as outpatient diabetes coordinator and is understandable agreeable with the plan of care.  Discharge  Diagnoses:  Active Problems:   Obesity (BMI 30-39.9)   DKA, type 1 ([place])  DKA in IDDM Type 1:  -Unclear precipitant. Previous notes state he no-showed endocrinology multiple times. LastHbA1c 15.8%.    And repeat this visit was 13.8 - Insulin gtt, IVF's as ordered, keep NPO while somnolent.  And he was transitioned off the insulin drip - CBGs improved, will recheck metabolic panel now and serially.  -His gap closed and he had a last CO2 of 24, anion gap of 9, chloride level of 107 - Recheck VBG to confirm we're going the right direction with acidosis.  - Mentating adequately to maintain airway, remain in SDU - Diabetes coordinator consulted and transition to long-acting and his NovoLog sliding scale -Sugars remained stable and improved and he was deemed stable for discharge given his resolution of symptoms  Pseudohyponatremia - Corrected hyperglycemia, continue IVF while he was hospitalized and improved  Abdominal Pain - Check abd XR and showed "Slightly prominent loops of small and large bowel suggesting adynamic ileus. Follow-up exam suggested to demonstrate resolution. Stool noted throughout the colon." -Given bowel regimen for discharge  Hypokalemia -Patient's potassium was 3.1 -Replete with p.o. potassium chloride 40 Eliquis twice daily x2 doses -Continue monitor and replete as necessary -Repeat CMP in outpatient setting  Microcytic Anemia -Likely dilutional drop and initially was hemoconcentrated as patient's BUNs/hematocrit went from 13.7/41.6 -Repeat hemoglobin/hematocrit was 10.7/24.2  -Check anemia panel outpatient setting next-continue to monitor for signs and symptoms bleeding; currently no overt bleeding noted  -Repeat CBC as an outpatient  Hypertriglyceridemia:  -Statin on MAR, will  await reconciliation and LFTs were normal -Resume Statin at d/C   Leukopenia -Mild -Patient's WBC went from 7.2 is now 3.8 -Continue to Monitor and Trend in the outpatient  setting -Repeat CBC as an outpatient   Obesity -Complicates overall prognosis and care -Estimated body mass index is 33.79 kg/m as calculated from the following:   Height as of this encounter: 6' (1.829 m).   Weight as of this encounter: 113 kg. -Weight Loss and Dietary Counseling given   Discharge Instructions  Discharge Instructions    Ambulatory referral to Nutrition and Diabetic Education   Complete by: As directed    Call MD for:  difficulty breathing, headache or visual disturbances   Complete by: As directed    Call MD for:  extreme fatigue   Complete by: As directed    Call MD for:  hives   Complete by: As directed    Call MD for:  persistant dizziness or light-headedness   Complete by: As directed    Call MD for:  persistant nausea and vomiting   Complete by: As directed    Call MD for:  redness, tenderness, or signs of infection (pain, swelling, redness, odor or green/yellow discharge around incision site)   Complete by: As directed    Call MD for:  severe uncontrolled pain   Complete by: As directed    Call MD for:  temperature >100.4   Complete by: As directed    Diet - low sodium heart healthy   Complete by: As directed    Diet Carb Modified   Complete by: As directed    Discharge instructions   Complete by: As directed    You were cared for by a hospitalist during your hospital stay. If you have any questions about your discharge medications or the care you received while you were in the hospital after you are discharged, you can call the unit and ask to speak with the hospitalist on call if the hospitalist that took care of you is not available. Once you are discharged, your primary care physician will handle any further medical issues. Please note that NO REFILLS for any discharge medications will be authorized once you are discharged, as it is imperative that you return to your primary care physician (or establish a relationship with a primary care physician if  you do not have one) for your aftercare needs so that they can reassess your need for medications and monitor your lab values.  Follow up with PCP and Outpatient Diabetes Education Coordinator. Take all medications as prescribed. If symptoms change or worsen please return to the ED for evaluation   Increase activity slowly   Complete by: As directed      Allergies as of 02/06/2021      Reactions   Iodinated Diagnostic Agents Swelling      Medication List    STOP taking these medications   atorvastatin 40 MG tablet Commonly known as: Lipitor     TAKE these medications   blood glucose meter kit and supplies Dispense based on patient and insurance preference. Use up to four times daily as directed. (FOR ICD-10 E10.9, E11.9).   fenofibrate 160 MG tablet Take 1 tablet (160 mg total) by mouth daily.   NovoLIN 70/30 FlexPen Relion (70-30) 100 UNIT/ML KwikPen Generic drug: insulin isophane & regular human Inject 30 Units into the skin 2 (two) times daily.   NovoLIN R FlexPen ReliOn 100 UNIT/ML Sopn Generic drug: Insulin Regular Human Inject 0-15 Units  as directed in the morning, at noon, and at bedtime.   ondansetron 4 MG tablet Commonly known as: ZOFRAN Take 1 tablet (4 mg total) by mouth every 6 (six) hours as needed for nausea.   Pen Needles 3/16" 31G X 5 MM Misc Use as directed in the morning, afternoon ,and evening.   polyethylene glycol 17 g packet Commonly known as: MIRALAX / GLYCOLAX Take 17 g by mouth daily. Start taking on: February 07, 2021   senna-docusate 8.6-50 MG tablet Commonly known as: Senokot-S Take 1 tablet by mouth at bedtime.       Follow-up Information    Charlott Rakes, MD Follow up.   Specialty: Family Medicine Why: Follow up within 1-2 weeks Contact information: [address] [place] [address] [phone]              Allergies  Allergen Reactions  . Iodinated Diagnostic Agents Swelling   Consultations:  Diabetes  Coordinatior  Procedures/Studies: DG Abd Portable 1V  Result Date: 02/05/2021 CLINICAL DATA:  Nausea and vomiting.  Worsening hyperglycemia. EXAM: PORTABLE ABDOMEN - 1 VIEW COMPARISON:  05/15/2020. FINDINGS: Soft tissue structures are unremarkable. Slightly prominent loops of small and large bowel suggesting adynamic ileus. Follow-up exam suggested to demonstrate resolution. Stool noted throughout the colon. No free air. Pelvic calcifications consistent phleboliths. No acute bony abnormality. IMPRESSION: Slightly prominent loops of small and large bowel suggesting adynamic ileus. Follow-up exam suggested to demonstrate resolution. Stool noted throughout the colon. Electronically Signed   By: Marcello Moores  Register   On: 02/05/2021 11:08    Subjective: Seen and examined at bedside and patient was improved and had no abdominal symptoms and no more nausea vomiting.  Tolerated his entire breakfast without issues.  Was deemed stable to be discharged and will follow up with PCP and diabetes education coronary as outpatient.  Discharge Exam: Vitals:   02/06/21 0800 02/06/21 1200  BP: 129/60   Pulse: 73   Resp: 16   Temp: 98 F (36.7 C) 97.7 F (36.5 C)  SpO2: 100%    Vitals:   02/06/21 0500 02/06/21 0600 02/06/21 0800 02/06/21 1200  BP:   129/60   Pulse: 69 76 73   Resp: _0 Temp:   98 F (36.7 C) 97.7 F (36.5 C)  TempSrc:   Oral Oral  SpO2: 100% 99% 100%   Weight:      Height:       General: Pt is alert, awake, not in acute distress Cardiovascular: RRR, S1/S2 +, no rubs, no gallops Respiratory: Slightly diminished bilaterally, no wheezing, no rhonchi Abdominal: Soft, NT, Distended 2/2 body habitus, bowel sounds + Extremities: no edema, no cyanosis  The results of significant diagnostics from this hospitalization (including imaging, microbiology, ancillary and laboratory) are listed below for reference.    Microbiology: Recent Results (from the past 240 hour(s))  Resp Panel by  RT-PCR (Flu A&B, Covid) Nasopharyngeal Swab     Status: None   Collection Time: 02/05/21  5:41 AM   Specimen: Nasopharyngeal Swab; Nasopharyngeal(NP) swabs in vial transport medium  Result Value Ref Range Status   SARS Coronavirus 2 by RT PCR NEGATIVE NEGATIVE Final    Comment: (NOTE) SARS-CoV-2 target nucleic acids are NOT DETECTED.  The SARS-CoV-2 RNA is generally detectable in upper respiratory specimens during the acute phase of infection. The lowest concentration of SARS-CoV-2 viral copies this assay can detect is 138 copies/mL. A negative result does not preclude SARS-Cov-2 infection and should not be  used as the sole basis for treatment or other patient management decisions. A negative result may occur with  improper specimen collection/handling, submission of specimen other than nasopharyngeal swab, presence of viral mutation(s) within the areas targeted by this assay, and inadequate number of viral copies(<138 copies/mL). A negative result must be combined with clinical observations, patient history, and epidemiological information. The expected result is Negative.  Fact Sheet for Patients:  EntrepreneurPulse.com.au  Fact Sheet for Healthcare Providers:  IncredibleEmployment.be  This test is no t yet approved or cleared by the [place] FDA and  has been authorized for detection and/or diagnosis of SARS-CoV-2 by FDA under an Emergency Use Authorization (EUA). This EUA will remain  in effect (meaning this test can be used) for the duration of the COVID-19 declaration under Section 564(b)(1) of the Act, 21 U.S.C.section 360bbb-3(b)(1), unless the authorization is terminated  or revoked sooner.       Influenza A by PCR NEGATIVE NEGATIVE Final   Influenza B by PCR NEGATIVE NEGATIVE Final    Comment: (NOTE) The Xpert Xpress SARS-CoV-2/FLU/RSV plus assay is intended as an aid in the diagnosis of influenza from Nasopharyngeal swab  specimens and should not be used as a sole basis for treatment. Nasal washings and aspirates are unacceptable for Xpert Xpress SARS-CoV-2/FLU/RSV testing.  Fact Sheet for Patients: EntrepreneurPulse.com.au  Fact Sheet for Healthcare Providers: IncredibleEmployment.be  This test is not yet approved or cleared by the [place] FDA and has been authorized for detection and/or diagnosis of SARS-CoV-2 by FDA under an Emergency Use Authorization (EUA). This EUA will remain in effect (meaning this test can be used) for the duration of the COVID-19 declaration under Section 564(b)(1) of the Act, 21 U.S.C. section 360bbb-3(b)(1), unless the authorization is terminated or revoked.  Performed at [facility], [address] [place], [place] [address]   MRSA PCR Screening     Status: None   Collection Time: 02/05/21  8:42 AM   Specimen: Nasal Mucosa; Nasopharyngeal  Result Value Ref Range Status   MRSA by PCR NEGATIVE NEGATIVE Final    Comment:        The GeneXpert MRSA Assay (FDA approved for NASAL specimens only), is one component of a comprehensive MRSA colonization surveillance program. It is not intended to diagnose MRSA infection nor to guide or monitor treatment for MRSA infections. Performed at [facility], [place] [address] [place], [place] [address]      Labs: BNP (last 3 results) No results for input(s): BNP in the last 8760 hours. Basic Metabolic Panel: Recent Labs  Lab 02/05/21 0112 02/05/21 1109 02/05/21 1259 02/05/21 1845 02/06/21 0239  NA 126* 135 134* 134* 140  K 3.5 3.1* 3.3* 3.4* 3.1*  CL 95* 105 105 106 107  CO2 16* 20* 20* 22 24  GLUCOSE 358* 165* 151* 195* 117*  BUN _0 CREATININE 1.07 0.80 0.71 0.79 0.85  CALCIUM 9.3 8.5* 8.6* 8.8* 8.9  MG  --  1.9  --   --   --   PHOS  --  2.8  --   --   --    Liver Function Tests: Recent Labs  Lab 02/05/21 1109  AST 17  ALT  31  ALKPHOS 52  BILITOT 1.5*  PROT 5.8*  ALBUMIN 3.5   No results for input(s): LIPASE, AMYLASE in the last 168 hours. No results for input(s): AMMONIA in the last 168 hours. CBC: Recent Labs  Lab  02/05/21 0112 02/06/21 0239  WBC 7.2 3.8*  NEUTROABS  --  1.6*  HGB 13.7 10.7*  HCT 41.6 34.2*  MCV 73.4* 75.3*  PLT 238 197   Cardiac Enzymes: No results for input(s): CKTOTAL, CKMB, CKMBINDEX, TROPONINI in the last 168 hours. BNP: Invalid input(s): POCBNP CBG: Recent Labs  Lab 02/06/21 0109 02/06/21 0208 02/06/21 0340 02/06/21 0752 02/06/21 1154  GLUCAP 161* 136* 133* 151* 251*   D-Dimer No results for input(s): DDIMER in the last 72 hours. Hgb A1c Recent Labs    02/05/21 1109  HGBA1C 13.8*   Lipid Profile No results for input(s): CHOL, HDL, LDLCALC, TRIG, CHOLHDL, LDLDIRECT in the last 72 hours. Thyroid function studies No results for input(s): TSH, T4TOTAL, T3FREE, THYROIDAB in the last 72 hours.  Invalid input(s): FREET3 Anemia work up No results for input(s): VITAMINB12, FOLATE, FERRITIN, TIBC, IRON, RETICCTPCT in the last 72 hours. Urinalysis    Component Value Date/Time   COLORURINE YELLOW 02/05/2021 0112   APPEARANCEUR CLEAR 02/05/2021 0112   LABSPEC >1.030 (H) 02/05/2021 0112   PHURINE 5.0 02/05/2021 0112   GLUCOSEU >=500 (A) 02/05/2021 0112   HGBUR TRACE (A) 02/05/2021 0112   BILIRUBINUR SMALL (A) 02/05/2021 0112   KETONESUR >80 (A) 02/05/2021 0112   PROTEINUR 30 (A) 02/05/2021 0112   NITRITE NEGATIVE 02/05/2021 0112   LEUKOCYTESUR NEGATIVE 02/05/2021 0112   Sepsis Labs Invalid input(s): PROCALCITONIN,  WBC,  LACTICIDVEN Microbiology Recent Results (from the past 240 hour(s))  Resp Panel by RT-PCR (Flu A&B, Covid) Nasopharyngeal Swab     Status: None   Collection Time: 02/05/21  5:41 AM   Specimen: Nasopharyngeal Swab; Nasopharyngeal(NP) swabs in vial transport medium  Result Value Ref Range Status   SARS Coronavirus 2 by RT PCR NEGATIVE  NEGATIVE Final    Comment: (NOTE) SARS-CoV-2 target nucleic acids are NOT DETECTED.  The SARS-CoV-2 RNA is generally detectable in upper respiratory specimens during the acute phase of infection. The lowest concentration of SARS-CoV-2 viral copies this assay can detect is 138 copies/mL. A negative result does not preclude SARS-Cov-2 infection and should not be used as the sole basis for treatment or other patient management decisions. A negative result may occur with  improper specimen collection/handling, submission of specimen other than nasopharyngeal swab, presence of viral mutation(s) within the areas targeted by this assay, and inadequate number of viral copies(<138 copies/mL). A negative result must be combined with clinical observations, patient history, and epidemiological information. The expected result is Negative.  Fact Sheet for Patients:  EntrepreneurPulse.com.au  Fact Sheet for Healthcare Providers:  IncredibleEmployment.be  This test is no t yet approved or cleared by the [place] FDA and  has been authorized for detection and/or diagnosis of SARS-CoV-2 by FDA under an Emergency Use Authorization (EUA). This EUA will remain  in effect (meaning this test can be used) for the duration of the COVID-19 declaration under Section 564(b)(1) of the Act, 21 U.S.C.section 360bbb-3(b)(1), unless the authorization is terminated  or revoked sooner.       Influenza A by PCR NEGATIVE NEGATIVE Final   Influenza B by PCR NEGATIVE NEGATIVE Final    Comment: (NOTE) The Xpert Xpress SARS-CoV-2/FLU/RSV plus assay is intended as an aid in the diagnosis of influenza from Nasopharyngeal swab specimens and should not be used as a sole basis for treatment. Nasal washings and aspirates are unacceptable for Xpert Xpress SARS-CoV-2/FLU/RSV testing.  Fact Sheet for Patients: EntrepreneurPulse.com.au  Fact Sheet for Healthcare  Providers: IncredibleEmployment.be  This test  is not yet approved or cleared by the [place] and has been authorized for detection and/or diagnosis of SARS-CoV-2 by FDA under an Emergency Use Authorization (EUA). This EUA will remain in effect (meaning this test can be used) for the duration of the COVID-19 declaration under Section 564(b)(1) of the Act, 21 U.S.C. section 360bbb-3(b)(1), unless the authorization is terminated or revoked.  Performed at [facility], [address] [place], [place] [address]   MRSA PCR Screening     Status: None   Collection Time: 02/05/21  8:42 AM   Specimen: Nasal Mucosa; Nasopharyngeal  Result Value Ref Range Status   MRSA by PCR NEGATIVE NEGATIVE Final    Comment:        The GeneXpert MRSA Assay (FDA approved for NASAL specimens only), is one component of a comprehensive MRSA colonization surveillance program. It is not intended to diagnose MRSA infection nor to guide or monitor treatment for MRSA infections. Performed at [facility], [place] [address], [place], [place] [address]     Time coordinating discharge: 35 minutes  SIGNED:  Kerney Elbe, DO [facility] 02/06/2021, 6:07 PM Pager is on [place]  If 7PM-7AM, please contact night-coverage [email or web address]

## 2021-02-06 NOTE — Progress Notes (Addendum)
AVS reviewed with patient. All follow up medications and appointments discussed.  All pt belongings with patient. Work noted provided and handed to pt.  IV access removed. Dressing applied. Wife at bedside to transport pt home via private vehicle.

## 2021-02-06 NOTE — Progress Notes (Signed)
Inpatient Diabetes Program Recommendations  AACE/ADA: New Consensus Statement on Inpatient Glycemic Control (2015)  Target Ranges:  Prepandial:   less than 140 mg/dL      Peak postprandial:   less than 180 mg/dL (1-2 hours)      Critically ill patients:  140 - 180 mg/dL   Lab Results  Component Value Date   GLUCAP 151 (H) 02/06/2021   HGBA1C 13.8 (H) 02/05/2021    Review of Glycemic Control  Spoke with pt this am about his discharge meds and f/u with [facility].   Inpatient Diabetes Program Recommendations:  (For discharge)  Novolin 70/30 Flexpen 30 units BID Novolin R Flexpen 0-15 units TID with meals Pen needles (#[id number])  Pt has meter and supplies. Encouraged pt to check blood sugars 3-4x/day. Take logbook to PCP at [facility].  Pt understands importance of controlling blood sugars to feel good and reduce risk of complications. Goal of HgbA1C is 7%.  F/U with [facility] PCP.  Thank you. Ailene Ards, RD, LDN, CDE Inpatient Diabetes Coordinator [phone]

## 2021-02-07 ENCOUNTER — Telehealth: Payer: Self-pay

## 2021-02-07 NOTE — Telephone Encounter (Signed)
Transition Care Management Follow-up Telephone Call  Date of discharge and from where: 02/06/2021, [facility]  How have you been since you were released from the hospital? He said he is feeling pretty good.   Any questions or concerns? Yes - he is concerned that his insulin needles leak when he injects the insulin. He said that he mentioned this when he was in the hospital. Informed him that Butch Penny, [facility] would be notified of his concern.   Items Reviewed:  Did the pt receive and understand the discharge instructions provided? Yes   Medications obtained and verified? Yes  - he said that he has all medications and did not have any questions about the med regime,   Other? No   Any new allergies since your discharge? No   Do you have support at home? Yes   Home Care and Equipment/Supplies: Were home health services ordered? no If so, what is the name of the agency? n/a  Has the agency set up a time to come to the patient's home? not applicable Were any new equipment or medical supplies ordered?  No What is the name of the medical supply agency? n/a Were you able to get the supplies/equipment? not applicable Do you have any questions related to the use of the equipment or supplies? No   He has a working glucometer. Blood sugar 256 this morning when he woke up. He has been instructed to keep a log of his blood sugars and bring the log to his appointment with PCP.    Functional Questionnaire: (I = Independent and D = Dependent) ADLs: independent  Follow up appointments reviewed:   PCP Hospital f/u appt confirmed? Yes  - Dr Alvis Lemmings 02/27/2021.  He did not want to be seen by another provider.   Specialist Hospital f/u appt confirmed? No , none scheduled at this time  Are transportation arrangements needed? No   If their condition worsens, is the pt aware to call PCP or go to the Emergency Dept.? Yes  Was the patient provided with contact information for the PCP's  office or ED? Yes  Was to pt encouraged to call back with questions or concerns? Yes

## 2021-02-07 NOTE — Telephone Encounter (Signed)
From the discharge call. Patient has appointment with Dr Alvis Lemmings 02/27/2021. He did not want to be seen by another provider.    He said he is feeling pretty good.    he is concerned that his insulin needles leak when he injects the insulin. He said that he mentioned this when he was in the hospital. Informed him that Butch Penny, [facility] would be notified of his concern.     he said that he has all medications and did not have any questions about the med regime,    He has a working glucometer. Blood sugar 256 this morning when he woke up. He has been instructed to keep a log of his blood sugars and bring the log to his appointment with PCP.

## 2021-02-12 NOTE — Telephone Encounter (Signed)
Patient has HFU 02/27/21. Transitions of care manager scheduled.

## 2021-02-13 ENCOUNTER — Other Ambulatory Visit: Payer: Self-pay

## 2021-02-27 ENCOUNTER — Other Ambulatory Visit: Payer: Self-pay

## 2021-02-27 ENCOUNTER — Ambulatory Visit: Payer: BC Managed Care – PPO | Attending: Family Medicine | Admitting: Family Medicine

## 2021-02-27 ENCOUNTER — Encounter: Payer: Self-pay | Admitting: Family Medicine

## 2021-02-27 VITALS — BP 128/89 | HR 67 | Ht 72.0 in | Wt 246.6 lb

## 2021-02-27 DIAGNOSIS — Z794 Long term (current) use of insulin: Secondary | ICD-10-CM | POA: Diagnosis not present

## 2021-02-27 DIAGNOSIS — Z23 Encounter for immunization: Secondary | ICD-10-CM

## 2021-02-27 DIAGNOSIS — E1165 Type 2 diabetes mellitus with hyperglycemia: Secondary | ICD-10-CM

## 2021-02-27 LAB — GLUCOSE, POCT (MANUAL RESULT ENTRY): POC Glucose: 290 mg/dl — AB (ref 70–99)

## 2021-02-27 NOTE — Progress Notes (Signed)
Subjective:  Patient ID: Louis Vargas, male    DOB: 1981-03-12  Age: 40 y.o. MRN: [id number]  CC: Hospitalization Follow-up   HPI Louis Vargas is a 40 year old male with a history of type 2 diabetes mellitus (A1c 13.8) hospitalized recently for DKA. He admits to running out of insulin prior to his hospitalization. Today he reports doing well and has been compliant with his insulin. Complains of insulin dripping after he administers his injection. Blood sugars at home have been 100-200 after meals per patient and he denies hypoglycemic episodes. He has no additional concerns today. Past Medical History:  Diagnosis Date  . Diabetes mellitus without complication ([place])   . Obesity (BMI 30.0-34.9)     History reviewed. No pertinent surgical history.  Family History  Problem Relation Age of Onset  . Diabetes Mother   . Diabetes Sister   . Diabetes Brother     Allergies  Allergen Reactions  . Iodinated Diagnostic Agents Swelling    Outpatient Medications Prior to Visit  Medication Sig Dispense Refill  . blood glucose meter kit and supplies Dispense based on patient and insurance preference. Use up to four times daily as directed. (FOR ICD-10 E10.9, E11.9). 1 each 0  . fenofibrate 160 MG tablet Take 1 tablet (160 mg total) by mouth daily. 30 tablet 0  . insulin isophane & regular human (NOVOLIN 70/30 FLEXPEN RELION) (70-30) 100 UNIT/ML KwikPen Inject 30 Units into the skin 2 (two) times daily. 60 mL 0  . Insulin Pen Needle (PEN NEEDLES 3/16") 31G X 5 MM MISC Use as directed in the morning, afternoon ,and evening. 100 each 0  . Insulin Regular Human (NOVOLIN R FLEXPEN RELION) 100 UNIT/ML SOPN Inject 0-15 Units as directed in the morning, at noon, and at bedtime. 15 mL 1  . ondansetron (ZOFRAN) 4 MG tablet Take 1 tablet (4 mg total) by mouth every 6 (six) hours as needed for nausea. 20 tablet 0  . polyethylene glycol (MIRALAX / GLYCOLAX) 17 g packet Take 17 g by mouth daily. 14 each  0  . senna-docusate (SENOKOT-S) 8.6-50 MG tablet Take 1 tablet by mouth at bedtime. 30 tablet 0  . atorvastatin (LIPITOR) 40 MG tablet Take 1 tablet (40 mg total) by mouth daily. (Patient not taking: No sig reported) 30 tablet 1   No facility-administered medications prior to visit.     ROS Review of Systems  Constitutional: Negative for activity change and appetite change.  HENT: Negative for sinus pressure and sore throat.   Eyes: Negative for visual disturbance.  Respiratory: Negative for cough, chest tightness and shortness of breath.   Cardiovascular: Negative for chest pain and leg swelling.  Gastrointestinal: Negative for abdominal distention, abdominal pain, constipation and diarrhea.  Endocrine: Negative.   Genitourinary: Negative for dysuria.  Musculoskeletal: Negative for joint swelling and myalgias.  Skin: Negative for rash.  Allergic/Immunologic: Negative.   Neurological: Negative for weakness, light-headedness and numbness.  Psychiatric/Behavioral: Negative for dysphoric mood and suicidal ideas.    Objective:  BP 128/89   Pulse 67   Ht 6' (1.829 m)   Wt 246 lb 9.6 oz (111.9 kg)   SpO2 97%   BMI 33.44 kg/m   BP/Weight 02/27/2021 03/03/257 03/04/7781  Systolic BP 423 536 -  Diastolic BP 89 60 -  Wt. (Lbs) 246.6 - 249.12  BMI 33.44 - 33.79      Physical Exam Constitutional:      Appearance: He is well-developed.  Neck:     Vascular:  No JVD.  Cardiovascular:     Rate and Rhythm: Normal rate.     Heart sounds: Normal heart sounds. No murmur heard.   Pulmonary:     Effort: Pulmonary effort is normal.     Breath sounds: Normal breath sounds. No wheezing or rales.  Chest:     Chest wall: No tenderness.  Abdominal:     General: Bowel sounds are normal. There is no distension.     Palpations: Abdomen is soft. There is no mass.     Tenderness: There is no abdominal tenderness.  Musculoskeletal:        General: Normal range of motion.     Right lower leg:  No edema.     Left lower leg: No edema.  Neurological:     Mental Status: He is alert and oriented to person, place, and time.  Psychiatric:        Mood and Affect: Mood normal.     CMP Latest Ref Rng & Units 02/06/2021 02/05/2021 02/05/2021  Glucose 70 - 99 mg/dL 117(H) 195(H) 151(H)  BUN 6 - 20 mg/dL '10 10 12  ' Creatinine 0.61 - 1.24 mg/dL 0.85 0.79 0.71  Sodium 135 - 145 mmol/L 140 134(L) 134(L)  Potassium 3.5 - 5.1 mmol/L 3.1(L) 3.4(L) 3.3(L)  Chloride 98 - 111 mmol/L 107 106 105  CO2 22 - 32 mmol/L 24 22 20(L)  Calcium 8.9 - 10.3 mg/dL 8.9 8.8(L) 8.6(L)  Total Protein 6.5 - 8.1 g/dL - - -  Total Bilirubin 0.3 - 1.2 mg/dL - - -  Alkaline Phos 38 - 126 U/L - - -  AST 15 - 41 U/L - - -  ALT 0 - 44 U/L - - -    Lipid Panel     Component Value Date/Time   CHOL 336 (H) 05/15/2020 1742   TRIG 1,293 (H) 05/15/2020 1742   HDL 20 (L) 05/15/2020 1742   CHOLHDL NOT REPORTED DUE TO HIGH TRIGLYCERIDES 05/15/2020 1742   VLDL UNABLE TO CALCULATE IF TRIGLYCERIDE OVER 400 mg/dL 05/15/2020 1742   LDLCALC UNABLE TO CALCULATE IF TRIGLYCERIDE OVER 400 mg/dL 05/15/2020 1742   LDLDIRECT UNABLE TO CALCULATE IF TRIGLYCERIDE IS >1293 mg/dL 05/15/2020 1742    CBC    Component Value Date/Time   WBC 3.8 (L) 02/06/2021 0239   RBC 4.54 02/06/2021 0239   HGB 10.7 (L) 02/06/2021 0239   HCT 34.2 (L) 02/06/2021 0239   PLT 197 02/06/2021 0239   MCV 75.3 (L) 02/06/2021 0239   MCH 23.6 (L) 02/06/2021 0239   MCHC 31.3 02/06/2021 0239   RDW 13.9 02/06/2021 0239   LYMPHSABS 1.7 02/06/2021 0239   MONOABS 0.4 02/06/2021 0239   EOSABS 0.1 02/06/2021 0239   BASOSABS 0.0 02/06/2021 0239    Lab Results  Component Value Date   HGBA1C 13.8 (H) 02/05/2021    Assessment & Plan:  1. Type 2 diabetes mellitus with hyperglycemia, with long-term current use of insulin (HCC) Uncontrolled with A1c of 13.8 which is above goal of less than 7.0 Blood sugar logs reveal improvement He does seem to have problems with  his administration technique and I have called in the clinical pharmacist for education - POCT glucose (manual entry) - Microalbumin / creatinine urine ratio - Basic Metabolic Panel   No orders of the defined types were placed in this encounter.   Follow-up: Return in about 3 months (around 05/29/2021) for Chronic disease management.       Charlott Rakes, MD, FAAFP. [place]  [facility] and [place], [place]   02/27/2021, 1:40 PM

## 2021-02-27 NOTE — Patient Instructions (Signed)
Diabetes Mellitus and Nutrition, Adult When you have diabetes, or diabetes mellitus, it is very important to have healthy eating habits because your blood sugar (glucose) levels are greatly affected by what you eat and drink. Eating healthy foods in the right amounts, at about the same times every day, can help you:  Control your blood glucose.  Lower your risk of heart disease.  Improve your blood pressure.  Reach or maintain a healthy weight. What can affect my meal plan? Every person with diabetes is different, and each person has different needs for a meal plan. Your health care provider may recommend that you work with a dietitian to make a meal plan that is best for you. Your meal plan may vary depending on factors such as:  The calories you need.  The medicines you take.  Your weight.  Your blood glucose, blood pressure, and cholesterol levels.  Your activity level.  Other health conditions you have, such as heart or kidney disease. How do carbohydrates affect me? Carbohydrates, also called carbs, affect your blood glucose level more than any other type of food. Eating carbs naturally raises the amount of glucose in your blood. Carb counting is a method for keeping track of how many carbs you eat. Counting carbs is important to keep your blood glucose at a healthy level, especially if you use insulin or take certain oral diabetes medicines. It is important to know how many carbs you can safely have in each meal. This is different for every person. Your dietitian can help you calculate how many carbs you should have at each meal and for each snack. How does alcohol affect me? Alcohol can cause a sudden decrease in blood glucose (hypoglycemia), especially if you use insulin or take certain oral diabetes medicines. Hypoglycemia can be a life-threatening condition. Symptoms of hypoglycemia, such as sleepiness, dizziness, and confusion, are similar to symptoms of having too much  alcohol.  Do not drink alcohol if: ? Your health care provider tells you not to drink. ? You are pregnant, may be pregnant, or are planning to become pregnant.  If you drink alcohol: ? Do not drink on an empty stomach. ? Limit how much you use to:  0-1 drink a day for women.  0-2 drinks a day for men. ? Be aware of how much alcohol is in your drink. In the [place] one drink equals one 12 oz bottle of beer (355 mL), one 5 oz glass of wine (148 mL), or one 1 oz glass of hard liquor (44 mL). ? Keep yourself hydrated with water, diet soda, or unsweetened iced tea.  Keep in mind that regular soda, juice, and other mixers may contain a lot of sugar and must be counted as carbs. What are tips for following this plan? Reading food labels  Start by checking the serving size on the "Nutrition Facts" label of packaged foods and drinks. The amount of calories, carbs, fats, and other nutrients listed on the label is based on one serving of the item. Many items contain more than one serving per package.  Check the total grams (g) of carbs in one serving. You can calculate the number of servings of carbs in one serving by dividing the total carbs by 15. For example, if a food has 30 g of total carbs per serving, it would be equal to 2 servings of carbs.  Check the number of grams (g) of saturated fats and trans fats in one serving. Choose foods that have   a low amount or none of these fats.  Check the number of milligrams (mg) of salt (sodium) in one serving. Most people should limit total sodium intake to less than 2,300 mg per day.  Always check the nutrition information of foods labeled as "low-fat" or "nonfat." These foods may be higher in added sugar or refined carbs and should be avoided.  Talk to your dietitian to identify your daily goals for nutrients listed on the label. Shopping  Avoid buying canned, pre-made, or processed foods. These foods tend to be high in fat, sodium, and added  sugar.  Shop around the outside edge of the grocery store. This is where you will most often find fresh fruits and vegetables, bulk grains, fresh meats, and fresh dairy. Cooking  Use low-heat cooking methods, such as baking, instead of high-heat cooking methods like deep frying.  Cook using healthy oils, such as olive, canola, or sunflower oil.  Avoid cooking with butter, cream, or high-fat meats. Meal planning  Eat meals and snacks regularly, preferably at the same times every day. Avoid going long periods of time without eating.  Eat foods that are high in fiber, such as fresh fruits, vegetables, beans, and whole grains. Talk with your dietitian about how many servings of carbs you can eat at each meal.  Eat 4-6 oz (112-168 g) of lean protein each day, such as lean meat, chicken, fish, eggs, or tofu. One ounce (oz) of lean protein is equal to: ? 1 oz (28 g) of meat, chicken, or fish. ? 1 egg. ?  cup (62 g) of tofu.  Eat some foods each day that contain healthy fats, such as avocado, nuts, seeds, and fish.   What foods should I eat? Fruits Berries. Apples. Oranges. Peaches. Apricots. Plums. Grapes. Mango. Papaya. Pomegranate. Kiwi. Cherries. Vegetables Lettuce. Spinach. Leafy greens, including kale, chard, collard greens, and mustard greens. Beets. Cauliflower. Cabbage. Broccoli. Carrots. Green beans. Tomatoes. Peppers. Onions. Cucumbers. Brussels sprouts. Grains Whole grains, such as whole-wheat or whole-grain bread, crackers, tortillas, cereal, and pasta. Unsweetened oatmeal. Quinoa. Brown or wild rice. Meats and other proteins Seafood. Poultry without skin. Lean cuts of poultry and beef. Tofu. Nuts. Seeds. Dairy Low-fat or fat-free dairy products such as milk, yogurt, and cheese. The items listed above may not be a complete list of foods and beverages you can eat. Contact a dietitian for more information. What foods should I avoid? Fruits Fruits canned with  syrup. Vegetables Canned vegetables. Frozen vegetables with butter or cream sauce. Grains Refined white flour and flour products such as bread, pasta, snack foods, and cereals. Avoid all processed foods. Meats and other proteins Fatty cuts of meat. Poultry with skin. Breaded or fried meats. Processed meat. Avoid saturated fats. Dairy Full-fat yogurt, cheese, or milk. Beverages Sweetened drinks, such as soda or iced tea. The items listed above may not be a complete list of foods and beverages you should avoid. Contact a dietitian for more information. Questions to ask a health care provider  Do I need to meet with a diabetes educator?  Do I need to meet with a dietitian?  What number can I call if I have questions?  When are the best times to check my blood glucose? Where to find more information:  American Diabetes Association: diabetes.org  Academy of Nutrition and Dietetics: www.eatright.org  National Institute of Diabetes and Digestive and Kidney Diseases: www.niddk.nih.gov  Association of Diabetes Care and Education Specialists: www.diabeteseducator.org Summary  It is important to have healthy eating   habits because your blood sugar (glucose) levels are greatly affected by what you eat and drink.  A healthy meal plan will help you control your blood glucose and maintain a healthy lifestyle.  Your health care provider may recommend that you work with a dietitian to make a meal plan that is best for you.  Keep in mind that carbohydrates (carbs) and alcohol have immediate effects on your blood glucose levels. It is important to count carbs and to use alcohol carefully. This information is not intended to replace advice given to you by your health care provider. Make sure you discuss any questions you have with your health care provider. Document Revised: 09/27/2019 Document Reviewed: 09/27/2019 Elsevier Patient Education  2021 Elsevier Inc.  

## 2021-02-28 LAB — BASIC METABOLIC PANEL
BUN/Creatinine Ratio: 16 (ref 9–20)
BUN: 15 mg/dL (ref 6–24)
CO2: 20 mmol/L (ref 20–29)
Calcium: 9.9 mg/dL (ref 8.7–10.2)
Chloride: 101 mmol/L (ref 96–106)
Creatinine, Ser: 0.96 mg/dL (ref 0.76–1.27)
Glucose: 307 mg/dL — ABNORMAL HIGH (ref 65–99)
Potassium: 4.1 mmol/L (ref 3.5–5.2)
Sodium: 140 mmol/L (ref 134–144)
eGFR: 102 mL/min/{1.73_m2} (ref >59)

## 2021-02-28 LAB — MICROALBUMIN / CREATININE URINE RATIO
Creatinine, Urine: 118.2 mg/dL
Microalb/Creat Ratio: 10 mg/g creat (ref 0–29)
Microalbumin, Urine: 11.6 ug/mL

## 2021-03-01 ENCOUNTER — Telehealth: Payer: Self-pay

## 2021-03-01 NOTE — Telephone Encounter (Signed)
Patient was called and a voicemail was left informing patient to return phone call for lab results.  CRM created for call back.

## 2021-03-01 NOTE — Telephone Encounter (Signed)
-----   Message from Hoy Register, MD sent at 03/01/2021  9:29 AM EDT ----- Labs reveal normal urine, elevated glucose and I will need him to continue with his regimen for diabetes which we discussed at his last office visit and keep his follow-up appointment with me.

## 2021-03-26 ENCOUNTER — Ambulatory Visit: Payer: Self-pay | Admitting: Family Medicine

## 2021-11-13 ENCOUNTER — Encounter: Payer: Self-pay | Admitting: Family Medicine

## 2021-11-13 ENCOUNTER — Telehealth: Payer: Self-pay

## 2021-11-13 ENCOUNTER — Ambulatory Visit: Payer: BC Managed Care – PPO | Attending: Family Medicine | Admitting: Family Medicine

## 2021-11-13 DIAGNOSIS — Z794 Long term (current) use of insulin: Secondary | ICD-10-CM | POA: Diagnosis not present

## 2021-11-13 DIAGNOSIS — E1165 Type 2 diabetes mellitus with hyperglycemia: Secondary | ICD-10-CM | POA: Diagnosis not present

## 2021-11-13 DIAGNOSIS — E785 Hyperlipidemia, unspecified: Secondary | ICD-10-CM

## 2021-11-13 DIAGNOSIS — E1169 Type 2 diabetes mellitus with other specified complication: Secondary | ICD-10-CM

## 2021-11-13 MED ORDER — NOVOLIN 70/30 FLEXPEN RELION (70-30) 100 UNIT/ML ~~LOC~~ SUPN: 30.0000 [IU] | PEN_INJECTOR | Freq: Two times a day (BID) | SUBCUTANEOUS | 6 refills | Status: DC

## 2021-11-13 MED ORDER — FENOFIBRATE 160 MG PO TABS: 160.0000 mg | ORAL_TABLET | Freq: Every day | ORAL | 6 refills | Status: DC

## 2021-11-13 MED ORDER — ATORVASTATIN CALCIUM 40 MG PO TABS: 40.0000 mg | ORAL_TABLET | Freq: Every day | ORAL | 6 refills | Status: DC

## 2021-11-13 MED ORDER — NOVOLIN R FLEXPEN RELION 100 UNIT/ML IJ SOPN: 0.0000 [IU] | PEN_INJECTOR | Freq: Three times a day (TID) | INTRAMUSCULAR | 6 refills | Status: DC

## 2021-11-13 NOTE — Telephone Encounter (Signed)
Pt has been scheduled and reminder has been mailed.  

## 2021-11-13 NOTE — Telephone Encounter (Signed)
-----   Message from Hoy Register, MD sent at 11/13/2021  3:15 PM EST ----- Regarding: 1 month appt Please schedule for 1 month follow-up appointment for diabetes. Thanks, Dr. Alvis Lemmings

## 2021-11-13 NOTE — Progress Notes (Signed)
Virtual Visit via Telephone Note  I connected with Louis Vargas, on 11/13/2021 at 2:17 PM by telephone due to the COVID-19 pandemic and verified that I am speaking with the correct person using two identifiers.   Consent: I discussed the limitations, risks, security and privacy concerns of performing an evaluation and management service by telephone and the availability of in person appointments. I also discussed with the patient that there may be a patient responsible charge related to this service. The patient expressed understanding and agreed to proceed.   Location of Patient: Home  Location of Provider: Clinic   Persons participating in Telemedicine visit: Louis Vargas Dr. Margarita Rana     History of Present Illness: Louis Vargas is a 41 y.o. year old male with type 2 diabetes mellitus (A1c 13.8), hyperlipidemia seen for an office visit.  His last office visit was in 02/2021.   He has been out of insulin since 08/2021 since he moved and he left his insulin in his old place. He has been using his wife's Metformin. His blood sugars have been really high up to the 600s and he would drink water to bring his sugars down. He has lost 34 lbs in the last 9 months and is wondering why. With regards to his hyperlipidemia he has also not been taking his atorvastatin. Past Medical History:  Diagnosis Date   Diabetes mellitus without complication ([place])    Obesity (BMI 30.0-34.9)    Allergies  Allergen Reactions   Iodinated Contrast Media Swelling    Current Outpatient Medications on File Prior to Visit  Medication Sig Dispense Refill   atorvastatin (LIPITOR) 40 MG tablet Take 1 tablet (40 mg total) by mouth daily. (Patient not taking: No sig reported) 30 tablet 1   blood glucose meter kit and supplies Dispense based on patient and insurance preference. Use up to four times daily as directed. (FOR ICD-10 E10.9, E11.9). 1 each 0   fenofibrate 160 MG tablet Take 1 tablet (160 mg total) by  mouth daily. 30 tablet 0   insulin isophane & regular human (NOVOLIN 70/30 FLEXPEN RELION) (70-30) 100 UNIT/ML KwikPen Inject 30 Units into the skin 2 (two) times daily. 60 mL 0   Insulin Pen Needle (PEN NEEDLES 3/16") 31G X 5 MM MISC Use as directed in the morning, afternoon ,and evening. 100 each 0   Insulin Regular Human (NOVOLIN R FLEXPEN RELION) 100 UNIT/ML SOPN Inject 0-15 Units as directed in the morning, at noon, and at bedtime. 15 mL 1   ondansetron (ZOFRAN) 4 MG tablet Take 1 tablet (4 mg total) by mouth every 6 (six) hours as needed for nausea. 20 tablet 0   polyethylene glycol (MIRALAX / GLYCOLAX) 17 g packet Take 17 g by mouth daily. 14 each 0   No current facility-administered medications on file prior to visit.    ROS: See HPI  Observations/Objective: Awake, alert, oriented x3 Not in acute distress Normal mood   CMP Latest Ref Rng & Units 02/27/2021 02/06/2021 02/05/2021  Glucose 65 - 99 mg/dL 307(H) 117(H) 195(H)  BUN 6 - 24 mg/dL '15 10 10  ' Creatinine 0.76 - 1.27 mg/dL 0.96 0.85 0.79  Sodium 134 - 144 mmol/L 140 140 134(L)  Potassium 3.5 - 5.2 mmol/L 4.1 3.1(L) 3.4(L)  Chloride 96 - 106 mmol/L 101 107 106  CO2 20 - 29 mmol/L '20 24 22  ' Calcium 8.7 - 10.2 mg/dL 9.9 8.9 8.8(L)  Total Protein 6.5 - 8.1 g/dL - - -  Total  Bilirubin 0.3 - 1.2 mg/dL - - -  Alkaline Phos 38 - 126 U/L - - -  AST 15 - 41 U/L - - -  ALT 0 - 44 U/L - - -    Lipid Panel     Component Value Date/Time   CHOL 336 (H) 05/15/2020 1742   TRIG 1,293 (H) 05/15/2020 1742   HDL 20 (L) 05/15/2020 1742   CHOLHDL NOT REPORTED DUE TO HIGH TRIGLYCERIDES 05/15/2020 1742   VLDL UNABLE TO CALCULATE IF TRIGLYCERIDE OVER 400 mg/dL 05/15/2020 1742   LDLCALC UNABLE TO CALCULATE IF TRIGLYCERIDE OVER 400 mg/dL 05/15/2020 1742   LDLDIRECT UNABLE TO CALCULATE IF TRIGLYCERIDE IS >1293 mg/dL 05/15/2020 1742    Lab Results  Component Value Date   HGBA1C 13.8 (H) 02/05/2021    Assessment and Plan: 1. Type 2  diabetes mellitus with hyperglycemia, with long-term current use of insulin (HCC) Uncontrolled with A1c of 13.8 due to running out of medications I have sent his refills to the pharmacy and he will follow-up with me in 1 month to review his blood sugar logs Hyperglycemic state that could explain his weight loss Counseled on Diabetic diet, my plate method, 482 minutes of moderate intensity exercise/week Blood sugar logs with fasting goals of 80-120 mg/dl, random of less than 180 and in the event of sugars less than 60 mg/dl or greater than 400 mg/dl encouraged to notify the clinic. Advised on the need for annual eye exams, annual foot exams, Pneumonia vaccine. - fenofibrate 160 MG tablet; Take 1 tablet (160 mg total) by mouth daily.  Dispense: 30 tablet; Refill: 6 - Insulin Regular Human (NOVOLIN R FLEXPEN RELION) 100 UNIT/ML KwikPen; Inject 0-15 Units as directed in the morning, at noon, and at bedtime.  Dispense: 15 mL; Refill: 6 - insulin isophane & regular human KwikPen (NOVOLIN 70/30 KWIKPEN) (70-30) 100 UNIT/ML KwikPen; Inject 30 Units into the skin 2 (two) times daily.  Dispense: 60 mL; Refill: 6  2. Hyperlipidemia associated with type 2 diabetes mellitus ([place]) Uncontrolled with severe hypertriglyceridemia He has been without his statin and fenofibrate which I have refilled Low-cholesterol diet - atorvastatin (LIPITOR) 40 MG tablet; Take 1 tablet (40 mg total) by mouth daily.  Dispense: 30 tablet; Refill: 6   Follow Up Instructions: 1 month for chronic disease management   I discussed the assessment and treatment plan with the patient. The patient was provided an opportunity to ask questions and all were answered. The patient agreed with the plan and demonstrated an understanding of the instructions.   The patient was advised to call back or seek an in-person evaluation if the symptoms worsen or if the condition fails to improve as anticipated.     I provided 16 minutes total of  non-face-to-face time during this encounter.   Charlott Rakes, MD, FAAFP. [facility] and [place], [place]   11/13/2021, 2:17 PM

## 2021-12-19 ENCOUNTER — Other Ambulatory Visit: Payer: Self-pay

## 2021-12-19 ENCOUNTER — Ambulatory Visit: Payer: BC Managed Care – PPO | Attending: Family Medicine | Admitting: Family Medicine

## 2021-12-19 VITALS — BP 130/89 | HR 74 | Ht 72.0 in | Wt 214.2 lb

## 2021-12-19 DIAGNOSIS — E1165 Type 2 diabetes mellitus with hyperglycemia: Secondary | ICD-10-CM

## 2021-12-19 DIAGNOSIS — Z794 Long term (current) use of insulin: Secondary | ICD-10-CM

## 2021-12-19 LAB — POCT GLYCOSYLATED HEMOGLOBIN (HGB A1C): HbA1c POC (<> result, manual entry): >15 % (ref 4.0–5.6)

## 2021-12-19 LAB — POCT URINALYSIS DIP (CLINITEK)
Bilirubin, UA: NEGATIVE
Blood, UA: NEGATIVE
Glucose, UA: >=1000 mg/dL — AB
Ketones, POC UA: NEGATIVE mg/dL
Leukocytes, UA: NEGATIVE
Nitrite, UA: NEGATIVE
POC PROTEIN,UA: NEGATIVE
Spec Grav, UA: 1.01 (ref 1.010–1.025)
Urobilinogen, UA: 0.2 E.U./dL
pH, UA: 7 (ref 5.0–8.0)

## 2021-12-19 LAB — GLUCOSE, POCT (MANUAL RESULT ENTRY): POC Glucose: >600 mg/dl (ref 70–99)

## 2021-12-19 NOTE — Progress Notes (Signed)
Subjective:  Patient ID: Louis Vargas, male    DOB: 1981/04/30  Age: 41 y.o. MRN: [id number]  CC: Diabetes   HPI Louis Vargas is a 41 y.o. year old male with a history of type 2 diabetes mellitus (A1c >15), hyperlipidemia seen for an office visit His last visit with me was a telehealth visit at which time he had endorsed being out of all his medications after a move and I had refilled all his medications.  Prior to that he had not seen me in about 9 months.  Interval History: Today his blood sugar reads > 600 and he complains of weight loss, polyuria, polydipsia, urinary frequency.  He informs me he did not take his medications today. Denies presence of abdominal pain, headache, blurry vision. Past Medical History:  Diagnosis Date   Diabetes mellitus without complication ([place])    Obesity (BMI 30.0-34.9)     No past surgical history on file.  Family History  Problem Relation Age of Onset   Diabetes Mother    Diabetes Sister    Diabetes Brother     Allergies  Allergen Reactions   Iodinated Contrast Media Swelling    Outpatient Medications Prior to Visit  Medication Sig Dispense Refill   atorvastatin (LIPITOR) 40 MG tablet Take 1 tablet (40 mg total) by mouth daily. 30 tablet 6   blood glucose meter kit and supplies Dispense based on patient and insurance preference. Use up to four times daily as directed. (FOR ICD-10 E10.9, E11.9). 1 each 0   fenofibrate 160 MG tablet Take 1 tablet (160 mg total) by mouth daily. 30 tablet 6   insulin isophane & regular human KwikPen (NOVOLIN 70/30 KWIKPEN) (70-30) 100 UNIT/ML KwikPen Inject 30 Units into the skin 2 (two) times daily. 60 mL 6   Insulin Pen Needle (PEN NEEDLES 3/16") 31G X 5 MM MISC Use as directed in the morning, afternoon ,and evening. 100 each 0   Insulin Regular Human (NOVOLIN R FLEXPEN RELION) 100 UNIT/ML KwikPen Inject 0-15 Units as directed in the morning, at noon, and at bedtime. 15 mL 6   ondansetron (ZOFRAN) 4 MG  tablet Take 1 tablet (4 mg total) by mouth every 6 (six) hours as needed for nausea. 20 tablet 0   polyethylene glycol (MIRALAX / GLYCOLAX) 17 g packet Take 17 g by mouth daily. 14 each 0   No facility-administered medications prior to visit.     ROS Review of Systems  Constitutional:  Positive for unexpected weight change. Negative for activity change and appetite change.  HENT:  Negative for sinus pressure and sore throat.   Eyes:  Negative for visual disturbance.  Respiratory:  Negative for cough, chest tightness and shortness of breath.   Cardiovascular:  Negative for chest pain and leg swelling.  Gastrointestinal:  Negative for abdominal distention, abdominal pain, constipation and diarrhea.  Endocrine: Negative.   Genitourinary:  Positive for frequency. Negative for dysuria.  Musculoskeletal:  Negative for joint swelling and myalgias.  Skin:  Negative for rash.  Allergic/Immunologic: Negative.   Neurological:  Negative for weakness, light-headedness and numbness.  Psychiatric/Behavioral:  Negative for dysphoric mood and suicidal ideas.    Objective:  BP 130/89    Pulse 74    Ht 6' (1.829 m)    Wt 214 lb 3.2 oz (97.2 kg)    SpO2 97%    BMI 29.05 kg/m   BP/Weight 12/19/2021 12/07/5595 02/02/6383  Systolic BP 536 468 032  Diastolic BP 89 89 60  Wt. (Lbs) 214.2 246.6 -  BMI 29.05 33.44 -      Physical Exam Constitutional:      Appearance: He is well-developed.  Cardiovascular:     Rate and Rhythm: Normal rate.     Heart sounds: Normal heart sounds. No murmur heard. Pulmonary:     Effort: Pulmonary effort is normal.     Breath sounds: Normal breath sounds. No wheezing or rales.  Chest:     Chest wall: No tenderness.  Abdominal:     General: Bowel sounds are normal. There is no distension.     Palpations: Abdomen is soft. There is no mass.     Tenderness: There is no abdominal tenderness.  Musculoskeletal:        General: Normal range of motion.     Right lower leg:  No edema.     Left lower leg: No edema.  Neurological:     Mental Status: He is alert and oriented to person, place, and time.  Psychiatric:        Mood and Affect: Mood normal.    CMP Latest Ref Rng & Units 02/27/2021 02/06/2021 02/05/2021  Glucose 65 - 99 mg/dL 307(H) 117(H) 195(H)  BUN 6 - 24 mg/dL _0 Creatinine 0.76 - 1.27 mg/dL 0.96 0.85 0.79  Sodium 134 - 144 mmol/L 140 140 134(L)  Potassium 3.5 - 5.2 mmol/L 4.1 3.1(L) 3.4(L)  Chloride 96 - 106 mmol/L 101 107 106  CO2 20 - 29 mmol/L _1 Calcium 8.7 - 10.2 mg/dL 9.9 8.9 8.8(L)  Total Protein 6.5 - 8.1 g/dL - - -  Total Bilirubin 0.3 - 1.2 mg/dL - - -  Alkaline Phos 38 - 126 U/L - - -  AST 15 - 41 U/L - - -  ALT 0 - 44 U/L - - -    Lipid Panel     Component Value Date/Time   CHOL 336 (H) 05/15/2020 1742   TRIG 1,293 (H) 05/15/2020 1742   HDL 20 (L) 05/15/2020 1742   CHOLHDL NOT REPORTED DUE TO HIGH TRIGLYCERIDES 05/15/2020 1742   VLDL UNABLE TO CALCULATE IF TRIGLYCERIDE OVER 400 mg/dL 05/15/2020 1742   LDLCALC UNABLE TO CALCULATE IF TRIGLYCERIDE OVER 400 mg/dL 05/15/2020 1742   LDLDIRECT UNABLE TO CALCULATE IF TRIGLYCERIDE IS >1293 mg/dL 05/15/2020 1742    CBC    Component Value Date/Time   WBC 3.8 (L) 02/06/2021 0239   RBC 4.54 02/06/2021 0239   HGB 10.7 (L) 02/06/2021 0239   HCT 34.2 (L) 02/06/2021 0239   PLT 197 02/06/2021 0239   MCV 75.3 (L) 02/06/2021 0239   MCH 23.6 (L) 02/06/2021 0239   MCHC 31.3 02/06/2021 0239   RDW 13.9 02/06/2021 0239   LYMPHSABS 1.7 02/06/2021 0239   MONOABS 0.4 02/06/2021 0239   EOSABS 0.1 02/06/2021 0239   BASOSABS 0.0 02/06/2021 0239    Lab Results  Component Value Date   HGBA1C >15 12/19/2021    Assessment & Plan:  1. Type 2 diabetes mellitus with hyperglycemia, with long-term current use of insulin (HCC) Glucose of greater than 600 and A1c greater than 15 He is at high risk of progression to DKA Urinalysis reveals glucose greater than 1000 Referred to the ED  stat for IV fluids and IV insulin Strongly encouraged to be compliant with medications - POCT glucose (manual entry) - POCT glycosylated hemoglobin (Hb A1C)    No orders of the defined types were placed in this encounter.   Follow-up:  Return in about 2 weeks (around 01/02/2022) for Chronic medical conditions.       Charlott Rakes, MD, FAAFP. [facility] and [place], [place]   12/19/2021, 10:31 AM

## 2021-12-19 NOTE — Progress Notes (Signed)
Discuss weight loss Frequent urination.

## 2021-12-19 NOTE — Patient Instructions (Signed)
Please go to the ED right away as your blood sugar is greater than 600 and you need IV fluids and IV insulin.

## 2022-03-26 ENCOUNTER — Ambulatory Visit: Payer: BC Managed Care – PPO | Admitting: Physician Assistant

## 2022-04-23 ENCOUNTER — Ambulatory Visit: Payer: BC Managed Care – PPO | Attending: Physician Assistant | Admitting: Physician Assistant

## 2022-04-23 ENCOUNTER — Encounter: Payer: Self-pay | Admitting: Physician Assistant

## 2022-04-23 VITALS — BP 128/90 | HR 71 | Wt 208.6 lb

## 2022-04-23 DIAGNOSIS — E1165 Type 2 diabetes mellitus with hyperglycemia: Secondary | ICD-10-CM | POA: Diagnosis not present

## 2022-04-23 DIAGNOSIS — Z794 Long term (current) use of insulin: Secondary | ICD-10-CM | POA: Diagnosis not present

## 2022-04-23 DIAGNOSIS — E785 Hyperlipidemia, unspecified: Secondary | ICD-10-CM

## 2022-04-23 DIAGNOSIS — E1169 Type 2 diabetes mellitus with other specified complication: Secondary | ICD-10-CM

## 2022-04-23 DIAGNOSIS — Z91199 Patient's noncompliance with other medical treatment and regimen due to unspecified reason: Secondary | ICD-10-CM

## 2022-04-23 LAB — POCT URINALYSIS DIP (CLINITEK)
Bilirubin, UA: NEGATIVE
Blood, UA: NEGATIVE
Glucose, UA: 500 mg/dL — AB
Ketones, POC UA: NEGATIVE mg/dL
Leukocytes, UA: NEGATIVE
Nitrite, UA: NEGATIVE
POC PROTEIN,UA: NEGATIVE
Spec Grav, UA: 1.01 (ref 1.010–1.025)
Urobilinogen, UA: 0.2 E.U./dL
pH, UA: 7 (ref 5.0–8.0)

## 2022-04-23 LAB — POCT GLYCOSYLATED HEMOGLOBIN (HGB A1C): HbA1c POC (<> result, manual entry): >15 % (ref 4.0–5.6)

## 2022-04-23 LAB — GLUCOSE, POCT (MANUAL RESULT ENTRY)
POC Glucose: >600 mg/dl (ref 70–99)
POC Glucose: >600 mg/dl (ref 70–99)

## 2022-04-23 MED ORDER — METFORMIN HCL 500 MG PO TABS: 1000.0000 mg | ORAL_TABLET | Freq: Two times a day (BID) | ORAL | 1 refills | Status: DC

## 2022-04-23 MED ORDER — INSULIN ASPART 100 UNIT/ML IJ SOLN
20.0000 [IU] | Freq: Once | INTRAMUSCULAR | Status: AC
  Administered 2022-04-23: 20 [IU] via SUBCUTANEOUS

## 2022-04-23 MED ORDER — INSULIN GLARGINE-YFGN 100 UNIT/ML ~~LOC~~ SOPN: 35.0000 [IU] | PEN_INJECTOR | Freq: Every day | SUBCUTANEOUS | 3 refills | Status: DC

## 2022-04-23 MED ORDER — ATORVASTATIN CALCIUM 40 MG PO TABS: 40.0000 mg | ORAL_TABLET | Freq: Every day | ORAL | 0 refills | Status: DC

## 2022-04-23 MED ORDER — "PEN NEEDLES 3/16"" 31G X 5 MM MISC": 1.0000 | Freq: Every day | 1 refills | Status: DC

## 2022-04-23 MED ORDER — SODIUM CHLORIDE 0.9 % IV BOLUS
1000.0000 mL | Freq: Once | INTRAVENOUS | Status: AC
  Administered 2022-04-23: 1000 mL via INTRAVENOUS

## 2022-04-23 MED ORDER — FENOFIBRATE 160 MG PO TABS: 160.0000 mg | ORAL_TABLET | Freq: Every day | ORAL | 0 refills | Status: AC

## 2022-04-23 NOTE — Patient Instructions (Addendum)
Because your blood sugars are reading very high after IV fluid and 20 units novolog in office; we recommend you go directly to the emergency department for further treatment   Check blood sugars fasting and at bedtime and record and bring to visit in 3 weeks.  Drink 80-100 ounces water daily

## 2022-04-23 NOTE — Progress Notes (Signed)
Patient ID: Louis Vargas, male   DOB: 07-19-1981, 41 y.o.   MRN: [id number]   Louis Vargas, is a 41 y.o. male  FXT:[id number]  GDJ:[id number]  DOB - 12-31-1980  Chief Complaint  Patient presents with   Diabetes   Medication Refill       Subjective:   Louis Vargas is a 41 y.o. male here today for diabetes check.  He has not been taking meds as directed.  He says he takes 40 units of 70/30 once daily bc he can't refrigerate or get 2nd dose in due to demanding work schedule.  He does not bring his glucometer today.  Denies h/o htn.  +polyuria and polydipsia.  "Feels fine otherwise."  No problems updated.  ALLERGIES: Allergies  Allergen Reactions   Iodinated Contrast Media Swelling    PAST MEDICAL HISTORY: Past Medical History:  Diagnosis Date   Diabetes mellitus without complication (HCC)    Obesity (BMI 30.0-34.9)     MEDICATIONS AT HOME: Prior to Admission medications   Medication Sig Start Date End Date Taking? Authorizing Provider  insulin glargine-yfgn (SEMGLEE, YFGN,) 100 UNIT/ML Pen Inject 35 Units into the skin daily. 04/23/22  Yes Freeman Caldron M, PA-C  metFORMIN (GLUCOPHAGE) 500 MG tablet Take 2 tablets (1,000 mg total) by mouth 2 (two) times daily with a meal. 04/23/22  Yes Emina Ribaudo M, PA-C  atorvastatin (LIPITOR) 40 MG tablet Take 1 tablet (40 mg total) by mouth daily. 04/23/22 07/22/22  Louis Donovan, PA-C  blood glucose meter kit and supplies Dispense based on patient and insurance preference. Use up to four times daily as directed. (FOR ICD-10 E10.9, E11.9). Patient not taking: Reported on 04/23/2022 03/07/20   Louis Coss, MD  fenofibrate 160 MG tablet Take 1 tablet (160 mg total) by mouth daily. 04/23/22   Louis Donovan, PA-C  Insulin Pen Needle (PEN NEEDLES 3/16") 31G X 5 MM MISC 1 each by Does not apply route daily after supper. 04/23/22   Louis Donovan, PA-C  ondansetron (ZOFRAN) 4 MG tablet Take 1 tablet (4 mg total) by mouth every 6 (six)  hours as needed for nausea. Patient not taking: Reported on 04/23/2022 02/06/21   Louis Noble Latif, DO  polyethylene glycol (MIRALAX / GLYCOLAX) 17 g packet Take 17 g by mouth daily. Patient not taking: Reported on 04/23/2022 02/07/21   Louis Noble Latif, DO    ROS: Neg HEENT Neg resp Neg cardiac Neg GI Neg GU Neg MS Neg psych Neg neuro  Objective:   Vitals:   04/23/22 1526  BP: (!) 166/116  Pulse: 71  SpO2: 95%  Weight: 208 lb 9.6 oz (94.6 kg)   Exam General appearance : Awake, alert, not in any distress. Speech Clear. Not toxic looking HEENT: Atraumatic and Normocephalic Neck: Supple, no JVD. No cervical lymphadenopathy.  Chest: Good air entry bilaterally, CTAB.  No rales/rhonchi/wheezing CVS: S1 S2 regular, no murmurs.  Extremities: B/L Lower Ext shows no edema, both legs are warm to touch Neurology: Awake alert, and oriented X 3, CN II-XII intact, Non focal Skin: No Rash  Data Review Lab Results  Component Value Date   HGBA1C >15 04/23/2022   HGBA1C >15 12/19/2021   HGBA1C 13.8 (H) 02/05/2021    Assessment & Plan   1. Type 2 diabetes mellitus with hyperglycemia, with long-term current use of insulin (HCC) His blood sugar was >600 on arrival but there were no ketones in his urine.  Therefore, 1L IVF was ran over 1 hour and  20 units of Novolog.  His blood sugar did not come down to a readable level.  Because his blood sugars are reading very high after IV fluid and 20 units novolog in office; we recommended he go directly to the emergency department for further treatment.  He verbalized understanding and agreed to this plan to myself and my Dolores, [place].  Will try and switch to a once daily insulin (for compliance)and titrate accordingly in 3 weeks at visit with [place] blood sugars fasting and at bedtime and record and bring to visit in 3 weeks.  Risks of uncontrolled blood sugars/morbidities and mortality Drink 80-100 ounces water daily - Glucose (CBG) - HgB  A1c - insulin aspart (novoLOG) injection 20 Units - insulin glargine-yfgn (SEMGLEE, YFGN,) 100 UNIT/ML Pen; Inject 35 Units into the skin daily.  Dispense: 15 mL; Refill: 3 - Comprehensive metabolic panel - Insulin Pen Needle (PEN NEEDLES 3/16") 31G X 5 MM MISC; 1 each by Does not apply route daily after supper.  Dispense: 100 each; Refill: 1 - fenofibrate 160 MG tablet; Take 1 tablet (160 mg total) by mouth daily.  Dispense: 90 tablet; Refill: 0 - POCT URINALYSIS DIP (CLINITEK) - metFORMIN (GLUCOPHAGE) 500 MG tablet; Take 2 tablets (1,000 mg total) by mouth 2 (two) times daily with a meal.  Dispense: 360 tablet; Refill: 1  2. Hyperlipidemia associated with type 2 diabetes mellitus (HCC) - atorvastatin (LIPITOR) 40 MG tablet; Take 1 tablet (40 mg total) by mouth daily.  Dispense: 90 tablet; Refill: 0 - fenofibrate 160 MG tablet; Take 1 tablet (160 mg total) by mouth daily.  Dispense: 90 tablet; Refill: 0  3. Noncompliance Compliance imperative-discussed at length.      Return for 3 weeks with Lurena Joiner for DM and elevated BP and 3 months with PCP for chronic conditions.  The patient was given clear instructions to go to ER or return to medical center if symptoms don't improve, worsen or new problems develop. The patient verbalized understanding. The patient was told to call to get lab results if they haven't heard anything in the next week.      Freeman Caldron, PA-C [facility] and [place], [place]   04/23/2022, 3:56 PM

## 2022-04-24 ENCOUNTER — Telehealth: Payer: Self-pay | Admitting: Family Medicine

## 2022-04-24 LAB — COMPREHENSIVE METABOLIC PANEL
ALT: 35 IU/L (ref 0–44)
AST: 18 IU/L (ref 0–40)
Albumin/Globulin Ratio: 1.6 (ref 1.2–2.2)
Albumin: 4.2 g/dL (ref 4.0–5.0)
Alkaline Phosphatase: 88 IU/L (ref 44–121)
BUN/Creatinine Ratio: 20 (ref 9–20)
BUN: 23 mg/dL (ref 6–24)
Bilirubin Total: <0.2 mg/dL (ref 0.0–1.2)
CO2: 22 mmol/L (ref 20–29)
Calcium: 9.8 mg/dL (ref 8.7–10.2)
Chloride: 93 mmol/L — ABNORMAL LOW (ref 96–106)
Creatinine, Ser: 1.17 mg/dL (ref 0.76–1.27)
Globulin, Total: 2.7 g/dL (ref 1.5–4.5)
Glucose: 767 mg/dL (ref 70–99)
Potassium: 4.4 mmol/L (ref 3.5–5.2)
Sodium: 135 mmol/L (ref 134–144)
Total Protein: 6.9 g/dL (ref 6.0–8.5)
eGFR: 80 mL/min/{1.73_m2} (ref >59)

## 2022-04-24 NOTE — Telephone Encounter (Signed)
Erroneous encounter

## 2022-04-24 NOTE — Telephone Encounter (Signed)
Received call from Triage Nurse with [facility] at 8:25am about critical glucose of 767. Lab has already been addressed by ordering Clinician and the patient received IV fluids and Novolog in the Clinic. He had  been advised to go to the ED by never did.

## 2022-07-08 ENCOUNTER — Ambulatory Visit: Payer: Self-pay

## 2022-07-08 DIAGNOSIS — E1165 Type 2 diabetes mellitus with hyperglycemia: Secondary | ICD-10-CM

## 2022-07-08 NOTE — Telephone Encounter (Signed)
  Chief Complaint: High blood glucose Symptoms: Vomiting current blood glucose is 600, Just before this machine could not take a reading - too high Frequency:  Pertinent Negatives: Patient denies  Disposition: [x] ED /[] Urgent Care (no appt availability in office) / [] Appointment(In office/virtual)/ []  [place] [facility]/ [] Home Care/ [] Refused Recommended Disposition /[] [place] [facility]/ []  Follow-up with PCP Additional Notes: Pt currently has a blood glucose of 600. Pt took an earlier reading which the machine was unable to calculate from being too high. Wife gave insulin to PT - unsure of # of units.    Reason for Disposition  Blood glucose > 500 mg/dL ( mmol/L)  Answer Assessment - Initial Assessment Questions 1. BLOOD GLUCOSE: "What is your blood glucose level?"      Unsure 2. ONSET: "When did you check the blood glucose?"     Now - 600 3. USUAL RANGE: "What is your glucose level usually?" (e.g., usual fasting morning value, usual evening value)      4. KETONES: "Do you check for ketones (urine or blood test strips)?" If Yes, ask: "What does the test show now?"       5. TYPE 1 or 2:  "Do you know what type of diabetes you have?"  (e.g., Type 1, Type 2, Gestational; doesn't know)      Type 2 6. INSULIN: "Do you take insulin?" "What type of insulin(s) do you use? What is the mode of delivery? (syringe, pen; injection or pump)?"      yes 7. DIABETES PILLS: "Do you take any pills for your diabetes?" If Yes, ask: "Have you missed taking any pills recently?"     yes 8. OTHER SYMPTOMS: "Do you have any symptoms?" (e.g., fever, frequent urination, difficulty breathing, dizziness, weakness, vomiting)     Vomiting 9. PREGNANCY: "Is there any chance you are pregnant?" "When was your last menstrual period?"     na  Protocols used: Diabetes - High Blood Sugar-A-AH

## 2022-07-09 MED ORDER — INSULIN GLARGINE-YFGN 100 UNIT/ML ~~LOC~~ SOPN: 45.0000 [IU] | PEN_INJECTOR | Freq: Every day | SUBCUTANEOUS | 3 refills | Status: DC

## 2022-07-09 NOTE — Telephone Encounter (Signed)
Attempt to call patient to see what measures and actions were taken on yesterday for elevated blood sugar. Do not see an ED note in chart.   Left message on voicemail to return call.

## 2022-07-09 NOTE — Telephone Encounter (Signed)
After his visit 2 months  ago he was to follow up in 3 weeks to titrate up his insulin but he didn't. I have increased his long acting insulin to 45 units. Please schedule an office visit for him either with myself or Franky Macho and he needs to bring in his blood sugar log. Thanks.

## 2022-07-09 NOTE — Addendum Note (Signed)
Addended by: Hoy Register on: 07/09/2022 04:17 PM   Modules accepted: Orders

## 2022-07-10 NOTE — Telephone Encounter (Signed)
Called placed to patient and VM was left informing patient to return phone call.

## 2022-10-17 ENCOUNTER — Telehealth: Payer: Self-pay | Admitting: *Deleted

## 2022-10-17 NOTE — Telephone Encounter (Signed)
Calling to follow up on fax sent to office    Marcelle Smiling is calling to follow up on gaps in care for patient: Information needed: Diabetes labs up to date- lipid panel/A1c  COVID and Flu vaccine information if available  Can either fax back form with information or call Natasha back Contact number: [phone]

## 2022-10-20 NOTE — Telephone Encounter (Signed)
Pt has no recent lab work or recent A1C from our office.

## 2023-02-02 ENCOUNTER — Ambulatory Visit: Payer: BC Managed Care – PPO | Attending: Family Medicine | Admitting: Family Medicine

## 2023-02-02 ENCOUNTER — Other Ambulatory Visit: Payer: Self-pay

## 2023-02-02 ENCOUNTER — Encounter: Payer: Self-pay | Admitting: Family Medicine

## 2023-02-02 VITALS — BP 103/67 | HR 81 | Ht 72.0 in | Wt 203.2 lb

## 2023-02-02 DIAGNOSIS — E1169 Type 2 diabetes mellitus with other specified complication: Secondary | ICD-10-CM

## 2023-02-02 DIAGNOSIS — Z1159 Encounter for screening for other viral diseases: Secondary | ICD-10-CM | POA: Diagnosis not present

## 2023-02-02 DIAGNOSIS — Z794 Long term (current) use of insulin: Secondary | ICD-10-CM

## 2023-02-02 DIAGNOSIS — E785 Hyperlipidemia, unspecified: Secondary | ICD-10-CM

## 2023-02-02 DIAGNOSIS — E1165 Type 2 diabetes mellitus with hyperglycemia: Secondary | ICD-10-CM

## 2023-02-02 LAB — POCT GLYCOSYLATED HEMOGLOBIN (HGB A1C): HbA1c, POC (controlled diabetic range): 12.8 % — AB (ref 0.0–7.0)

## 2023-02-02 MED ORDER — NOVOLOG FLEXPEN 100 UNIT/ML ~~LOC~~ SOPN: 0.0000 [IU] | PEN_INJECTOR | Freq: Three times a day (TID) | SUBCUTANEOUS | 6 refills | Status: DC

## 2023-02-02 MED ORDER — ATORVASTATIN CALCIUM 40 MG PO TABS
40.0000 mg | ORAL_TABLET | Freq: Every day | ORAL | 0 refills | Status: DC
Start: 2023-02-02 — End: 2024-06-14
  Filled 2023-02-02: qty 90, 90d supply, fill #0

## 2023-02-02 MED ORDER — INSULIN GLARGINE-YFGN 100 UNIT/ML ~~LOC~~ SOPN
30.0000 [IU] | PEN_INJECTOR | Freq: Every day | SUBCUTANEOUS | 3 refills | Status: DC
Start: 2023-02-02 — End: 2024-03-03
  Filled 2023-02-02: qty 9, 30d supply, fill #0

## 2023-02-02 MED ORDER — NOVOLOG FLEXPEN 100 UNIT/ML ~~LOC~~ SOPN
0.0000 [IU] | PEN_INJECTOR | Freq: Three times a day (TID) | SUBCUTANEOUS | 6 refills | Status: DC
Start: 2023-02-02 — End: 2024-03-03
  Filled 2023-02-02: qty 9, 25d supply, fill #0

## 2023-02-02 MED ORDER — INSULIN GLARGINE-YFGN 100 UNIT/ML ~~LOC~~ SOPN: 30.0000 [IU] | PEN_INJECTOR | Freq: Every day | SUBCUTANEOUS | 3 refills | Status: DC

## 2023-02-02 NOTE — Progress Notes (Signed)
Subjective:  Patient ID: Louis Vargas, male    DOB: 1980-12-26  Age: 42 y.o. MRN: [id number]  CC: Diabetes   HPI Louis Vargas is a 42 y.o. year old male with a history of  type 2 diabetes mellitus (A1c 12.8), hyperlipidemia seen for an office visit  Last office visit was in 04/2022.  Interval History: In 09/2022 he had a hospitalization at [facility] for uncontrolled type 2 diabetes mellitus with hyperosmolar nonketotic hyperglycemia and was discharged on Semglee and NovoLog sliding scale. He administers Novolog 30 units before meals about 3 x/day Medication list reveals he should also be on Semglee but he has not been administering it. Blood sugars are in the 300s. He informs me that he is aware of a diabetic diet and has been trying to adhere to it.  Past Medical History:  Diagnosis Date   Diabetes mellitus without complication    Obesity (BMI 30.0-34.9)     No past surgical history on file.  Family History  Problem Relation Age of Onset   Diabetes Mother    Diabetes Sister    Diabetes Brother     Social History   Socioeconomic History   Marital status: Married    Spouse name: Dimtria   Number of children: Not on file   Years of education: Not on file   Highest education level: Not on file  Occupational History   Occupation: "makes parts"  Tobacco Use   Smoking status: Never   Smokeless tobacco: Never  Vaping Use   Vaping Use: Never used  Substance and Sexual Activity   Alcohol use: Yes    Comment: occ   Drug use: No   Sexual activity: Yes  Other Topics Concern   Not on file  Social History Narrative   Not on file   Social Determinants of Health   Financial Resource Strain: Not on file  Food Insecurity: Not on file  Transportation Needs: Not on file  Physical Activity: Not on file  Stress: Not on file  Social Connections: Not on file    Allergies  Allergen Reactions   Iodinated Contrast Media Swelling     Outpatient Medications Prior to Visit  Medication Sig Dispense Refill   insulin isophane & regular human KwikPen (NOVOLIN 70/30 KWIKPEN) (70-30) 100 UNIT/ML KwikPen Inject 30 Units into the skin in the morning and at bedtime.     blood glucose meter kit and supplies Dispense based on patient and insurance preference. Use up to four times daily as directed. (FOR ICD-10 E10.9, E11.9). (Patient not taking: Reported on 04/23/2022) 1 each 0   fenofibrate 160 MG tablet Take 1 tablet (160 mg total) by mouth daily. (Patient not taking: Reported on 02/02/2023) 90 tablet 0   Insulin Pen Needle (PEN NEEDLES 3/16") 31G X 5 MM MISC 1 each by Does not apply route daily after supper. (Patient not taking: Reported on 02/02/2023) 100 each 1   ondansetron (ZOFRAN) 4 MG tablet Take 1 tablet (4 mg total) by mouth every 6 (six) hours as needed for nausea. (Patient not taking: Reported on 04/23/2022) 20 tablet 0   polyethylene glycol (MIRALAX / GLYCOLAX) 17 g packet Take 17 g by mouth daily. (Patient not taking: Reported on 04/23/2022) 14 each 0   atorvastatin (LIPITOR) 40 MG tablet Take 1 tablet (40 mg total) by mouth daily. 90 tablet 0   insulin glargine-yfgn (SEMGLEE, YFGN,) 100 UNIT/ML Pen Inject 45 Units into the skin daily. (Patient not taking: Reported  on 02/02/2023) 30 mL 3   metFORMIN (GLUCOPHAGE) 500 MG tablet Take 2 tablets (1,000 mg total) by mouth 2 (two) times daily with a meal. (Patient not taking: Reported on 02/02/2023) 360 tablet 1   No facility-administered medications prior to visit.     ROS Review of Systems  Constitutional:  Negative for activity change and appetite change.  HENT:  Negative for sinus pressure and sore throat.   Respiratory:  Negative for chest tightness, shortness of breath and wheezing.   Cardiovascular:  Negative for chest pain and palpitations.  Gastrointestinal:  Negative for abdominal distention, abdominal pain and constipation.  Genitourinary: Negative.   Musculoskeletal:  Negative.   Psychiatric/Behavioral:  Negative for behavioral problems and dysphoric mood.     Objective:  BP 103/67   Pulse 81   Ht 6' (1.829 m)   Wt 203 lb 3.2 oz (92.2 kg)   SpO2 98%   BMI 27.56 kg/m      02/02/2023    2:51 PM 02/02/2023    2:01 PM 04/23/2022    4:23 PM  BP/Weight  Systolic BP [id number]  Diastolic BP 67 [id number] 90  Wt. (Lbs)  203.2   BMI  27.56 kg/m2       Physical Exam Constitutional:      Appearance: He is well-developed.  Cardiovascular:     Rate and Rhythm: Normal rate.     Heart sounds: Normal heart sounds. No murmur heard. Pulmonary:     Effort: Pulmonary effort is normal.     Breath sounds: Normal breath sounds. No wheezing or rales.  Chest:     Chest wall: No tenderness.  Abdominal:     General: Bowel sounds are normal. There is no distension.     Palpations: Abdomen is soft. There is no mass.     Tenderness: There is no abdominal tenderness.  Musculoskeletal:        General: Normal range of motion.     Right lower leg: No edema.     Left lower leg: No edema.  Neurological:     Mental Status: He is alert and oriented to person, place, and time.  Psychiatric:        Mood and Affect: Mood normal.        Latest Ref Rng & Units 04/23/2022    4:45 PM 02/27/2021   11:15 AM 02/06/2021    2:39 AM  CMP  Glucose 70 - 99 mg/dL 767  307  117   BUN 6 - 24 mg/dL 23  15  10    Creatinine 0.76 - 1.27 mg/dL 1.17  0.96  0.85   Sodium 134 - 144 mmol/L 135  140  140   Potassium 3.5 - 5.2 mmol/L 4.4  4.1  3.1   Chloride 96 - 106 mmol/L 93  101  107   CO2 20 - 29 mmol/L 22  20  24    Calcium 8.7 - 10.2 mg/dL 9.8  9.9  8.9   Total Protein 6.0 - 8.5 g/dL 6.9     Total Bilirubin 0.0 - 1.2 mg/dL <0.2     Alkaline Phos 44 - 121 IU/L 88     AST 0 - 40 IU/L 18     ALT 0 - 44 IU/L 35       Lipid Panel     Component Value Date/Time   CHOL 336 (H) 05/15/2020 1742   TRIG 1,293 (H) 05/15/2020 1742   HDL 20 (L) 05/15/2020 1742   CHOLHDL  NOT REPORTED DUE TO  HIGH TRIGLYCERIDES 05/15/2020 1742   VLDL UNABLE TO CALCULATE IF TRIGLYCERIDE OVER 400 mg/dL 05/15/2020 1742   LDLCALC UNABLE TO CALCULATE IF TRIGLYCERIDE OVER 400 mg/dL 05/15/2020 1742   LDLDIRECT UNABLE TO CALCULATE IF TRIGLYCERIDE IS >1293 mg/dL 05/15/2020 1742    CBC    Component Value Date/Time   WBC 3.8 (L) 02/06/2021 0239   RBC 4.54 02/06/2021 0239   HGB 10.7 (L) 02/06/2021 0239   HCT 34.2 (L) 02/06/2021 0239   PLT 197 02/06/2021 0239   MCV 75.3 (L) 02/06/2021 0239   MCH 23.6 (L) 02/06/2021 0239   MCHC 31.3 02/06/2021 0239   RDW 13.9 02/06/2021 0239   LYMPHSABS 1.7 02/06/2021 0239   MONOABS 0.4 02/06/2021 0239   EOSABS 0.1 02/06/2021 0239   BASOSABS 0.0 02/06/2021 0239    Lab Results  Component Value Date   HGBA1C 12.8 (A) 02/02/2023    Assessment & Plan:  1. Type 2 diabetes mellitus with hyperglycemia, with long-term current use of insulin Uncontrolled with A1c of 12.8, goal is less than 7.0 Lack of comprehension with regards to administration instructions of his insulin also contributing Provided proper education and he will restart Semglee at 30 units nightly and he will be on NovoLog sliding scale.  I have provided him a copy of the sliding scale as follows: For blood sugars 0-150 give 0 units of insulin, 151-200 give 2 units of insulin, 201-250 give 4 units, 251-300 give 6 units, 301-350 give 8 units, 351-400 give 10 units,> 400 give 12 units and call M.D. Discussed hypoglycemia protocol. Counseled on Diabetic diet, my plate method, [id number] minutes of moderate intensity exercise/week Blood sugar logs with fasting goals of 80-120 mg/dl, random of less than 180 and in the event of sugars less than 60 mg/dl or greater than 400 mg/dl encouraged to notify the clinic. Advised on the need for annual eye exams, annual foot exams, Pneumonia vaccine. - Microalbumin / creatinine urine ratio - CMP14+EGFR - POCT glycosylated hemoglobin (Hb A1C) - Ambulatory referral to  Ophthalmology - insulin aspart (NOVOLOG FLEXPEN) 100 UNIT/ML FlexPen; Inject 0-12 Units into the skin 3 (three) times daily with meals. Per Sliding scale (For blood sugars 0-150 give 0 units of insulin, 151-200 give 2 units of insulin, 201-250 give 4 units, 251-300 give 6 units, 301-350 give 8 units, 351-400 give 10 units,> 400 give 12 units)  Dispense: 30 mL; Refill: 6 - insulin glargine-yfgn (SEMGLEE, YFGN,) 100 UNIT/ML Pen; Inject 30 Units into the skin daily.  Dispense: 30 mL; Refill: 3  2. Hyperlipidemia associated with type 2 diabetes mellitus He has not been adherent with this Restart Lipitor Low-cholesterol diet - atorvastatin (LIPITOR) 40 MG tablet; Take 1 tablet (40 mg total) by mouth daily.  Dispense: 90 tablet; Refill: 0  3. Need for hepatitis C screening test - HCV Ab w Reflex to Quant PCR    Meds ordered this encounter  Medications   DISCONTD: insulin glargine-yfgn (SEMGLEE, YFGN,) 100 UNIT/ML Pen    Sig: Inject 30 Units into the skin daily.    Dispense:  30 mL    Refill:  3    Dose increase   DISCONTD: insulin aspart (NOVOLOG FLEXPEN) 100 UNIT/ML FlexPen    Sig: Inject 0-12 Units into the skin 3 (three) times daily with meals. Per Sliding scale    Dispense:  30 mL    Refill:  6    For blood sugars 0-150 give 0 units of insulin, 151-200 give 2  units of insulin, 201-250 give 4 units, 251-300 give 6 units, 301-350 give 8 units, 351-400 give 10 units,> 400 give 12 units   atorvastatin (LIPITOR) 40 MG tablet    Sig: Take 1 tablet (40 mg total) by mouth daily.    Dispense:  90 tablet    Refill:  0   insulin aspart (NOVOLOG FLEXPEN) 100 UNIT/ML FlexPen    Sig: Inject 0-12 Units into the skin 3 (three) times daily with meals. Per Sliding scale    Dispense:  30 mL    Refill:  6    For blood sugars 0-150 give 0 units of insulin, 151-200 give 2 units of insulin, 201-250 give 4 units, 251-300 give 6 units, 301-350 give 8 units, 351-400 give 10 units,> 400 give 12 units    insulin glargine-yfgn (SEMGLEE, YFGN,) 100 UNIT/ML Pen    Sig: Inject 30 Units into the skin daily.    Dispense:  30 mL    Refill:  3    Dose increase    Follow-up: Return in about 3 months (around 05/04/2023).       Charlott Rakes, MD, FAAFP. [facility] and [place], [place]   02/02/2023, 2:55 PM

## 2023-02-02 NOTE — Patient Instructions (Signed)

## 2023-02-02 NOTE — Progress Notes (Signed)
Pain in both legs.

## 2023-02-03 LAB — CMP14+EGFR
ALT: 15 IU/L (ref 0–44)
AST: 10 IU/L (ref 0–40)
Albumin/Globulin Ratio: 1.7 (ref 1.2–2.2)
Albumin: 4.2 g/dL (ref 4.1–5.1)
Alkaline Phosphatase: 67 IU/L (ref 44–121)
BUN/Creatinine Ratio: 10 (ref 9–20)
BUN: 12 mg/dL (ref 6–24)
Bilirubin Total: 0.3 mg/dL (ref 0.0–1.2)
CO2: 22 mmol/L (ref 20–29)
Calcium: 9.9 mg/dL (ref 8.7–10.2)
Chloride: 105 mmol/L (ref 96–106)
Creatinine, Ser: 1.25 mg/dL (ref 0.76–1.27)
Globulin, Total: 2.5 g/dL (ref 1.5–4.5)
Glucose: 113 mg/dL — ABNORMAL HIGH (ref 70–99)
Potassium: 3.2 mmol/L — ABNORMAL LOW (ref 3.5–5.2)
Sodium: 144 mmol/L (ref 134–144)
Total Protein: 6.7 g/dL (ref 6.0–8.5)
eGFR: 74 mL/min/{1.73_m2} (ref >59)

## 2023-02-03 LAB — SPECIMEN STATUS REPORT

## 2023-02-03 LAB — HCV AB W REFLEX TO QUANT PCR: HCV Ab: NONREACTIVE

## 2023-02-03 LAB — HCV INTERPRETATION

## 2023-02-05 ENCOUNTER — Other Ambulatory Visit: Payer: Self-pay | Admitting: Family Medicine

## 2023-02-05 MED ORDER — POTASSIUM CHLORIDE ER 10 MEQ PO TBCR
10.0000 meq | EXTENDED_RELEASE_TABLET | Freq: Every day | ORAL | 1 refills | Status: DC
Start: 2023-02-05 — End: 2024-06-15

## 2023-02-18 IMAGING — DX DG ABD PORTABLE 1V
2 series · 2 of 2 positions shown · non-contrast
Comparison: 05/15/2020.

CLINICAL DATA: Nausea and vomiting.  Worsening hyperglycemia.

EXAM:
PORTABLE ABDOMEN - 1 VIEW

[abdomen kub (1 of 2)]
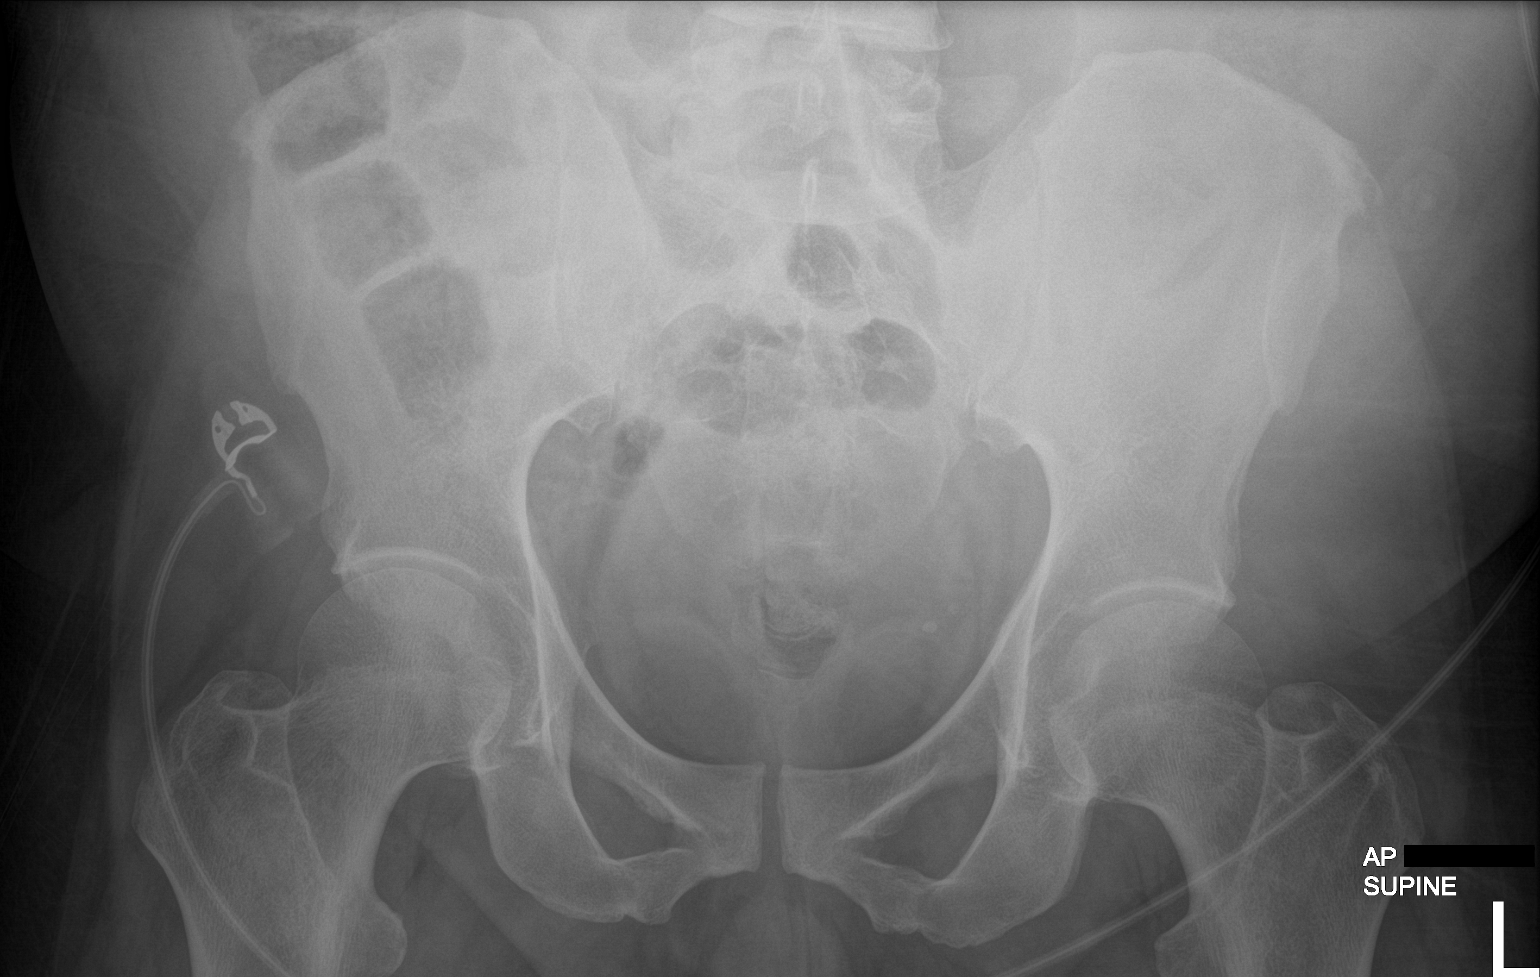

[abdomen kub (2 of 2)]
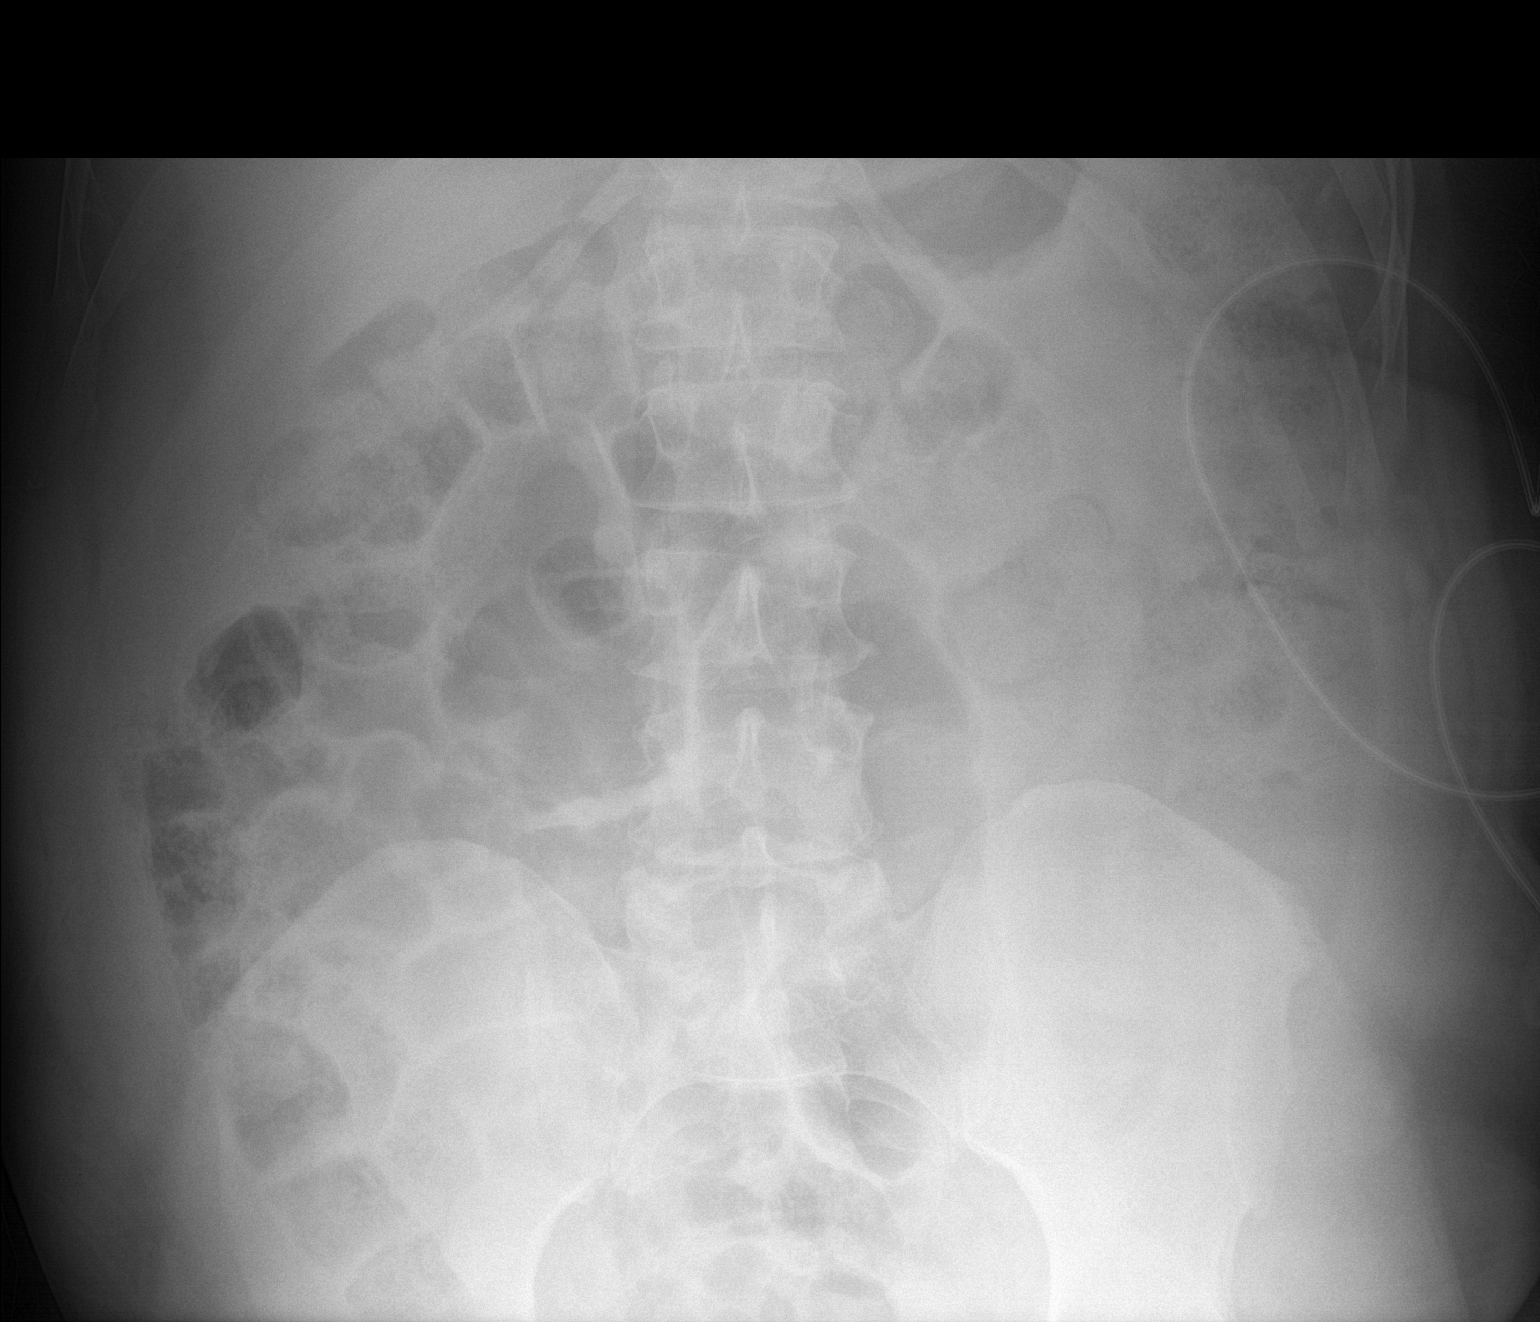

[2 of 2 positions shown; findings below may reference images not displayed]

FINDINGS: Soft tissue structures are unremarkable. Slightly prominent loops of
small and large bowel suggesting adynamic ileus. Follow-up exam
suggested to demonstrate resolution. Stool noted throughout the
colon. No free air. Pelvic calcifications consistent phleboliths. No
acute bony abnormality.
IMPRESSION: Slightly prominent loops of small and large bowel suggesting
adynamic ileus. Follow-up exam suggested to demonstrate resolution.
Stool noted throughout the colon.

## 2024-03-03 ENCOUNTER — Other Ambulatory Visit: Payer: Self-pay | Admitting: Family Medicine

## 2024-03-03 ENCOUNTER — Other Ambulatory Visit: Payer: Self-pay

## 2024-03-03 DIAGNOSIS — Z794 Long term (current) use of insulin: Secondary | ICD-10-CM

## 2024-03-04 ENCOUNTER — Other Ambulatory Visit: Payer: Self-pay

## 2024-03-04 MED ORDER — NOVOLOG FLEXPEN 100 UNIT/ML ~~LOC~~ SOPN
0.0000 [IU] | PEN_INJECTOR | Freq: Three times a day (TID) | SUBCUTANEOUS | 6 refills | Status: DC
  Filled 2024-03-04: qty 9, 25d supply, fill #0

## 2024-03-04 MED ORDER — INSULIN GLARGINE-YFGN 100 UNIT/ML ~~LOC~~ SOPN
30.0000 [IU] | PEN_INJECTOR | Freq: Every day | SUBCUTANEOUS | 3 refills | Status: DC
Start: 2024-03-04 — End: 2024-06-14
  Filled 2024-03-04: qty 9, 30d supply, fill #0

## 2024-03-15 ENCOUNTER — Other Ambulatory Visit: Payer: Self-pay

## 2024-03-16 ENCOUNTER — Other Ambulatory Visit: Payer: Self-pay

## 2024-05-02 ENCOUNTER — Other Ambulatory Visit: Payer: Self-pay | Admitting: Family Medicine

## 2024-05-02 ENCOUNTER — Encounter: Admitting: Family Medicine

## 2024-05-02 DIAGNOSIS — E1165 Type 2 diabetes mellitus with hyperglycemia: Secondary | ICD-10-CM

## 2024-05-02 NOTE — Telephone Encounter (Unsigned)
 Copied from CRM [id number]. Topic: Clinical - Medication Refill >> May 02, 2024 10:58 AM Larissa S wrote: Medication: insulin  aspart (NOVOLOG  FLEXPEN) 100 UNIT/ML FlexPen  Has the patient contacted their pharmacy? No (Agent: If no, request that the patient contact the pharmacy for the refill. If patient does not wish to contact the pharmacy document the reason why and proceed with request.) (Agent: If yes, when and what did the pharmacy advise?)  This is the patient's preferred pharmacy:  [facility] [address] ([address] [place] - [address] [place] ([place]) [place] [address] Phone: [phone] Fax: [phone]  Is this the correct pharmacy for this prescription? Yes If no, delete pharmacy and type the correct one.   Has the prescription been filled recently? No  Is the patient out of the medication? Yes  Has the patient been seen for an appointment in the last year OR does the patient have an upcoming appointment? Yes  Can we respond through MyChart? No  Agent: Please be advised that Rx refills may take up to 3 business days. We ask that you follow-up with your pharmacy.

## 2024-05-04 NOTE — Telephone Encounter (Signed)
 Requested medication (s) are due for refill today: yes  Requested medication (s) are on the active medication list: yes  Last refill:  03/04/24 #30 ml 6 refills  Future visit scheduled: yes 06/14/24  Notes to clinic:  tapered drug . Last OV 02/02/23. Do you want to refill Rx?     Requested Prescriptions  Pending Prescriptions Disp Refills   insulin  aspart (NOVOLOG  FLEXPEN) 100 UNIT/ML FlexPen 30 mL 6    Sig: Inject 0-12 Units into the skin 3 (three) times daily with meals. Per Sliding scale (For blood sugars 0-150 give 0 units of insulin , 151-200 give 2 units of insulin , 201-250 give 4 units, 251-300 give 6 units, 301-350 give 8 units, 351-400 give 10 units,> 400 give 12 units)     Endocrinology:  Diabetes - Insulins Failed - 05/04/2024  9:29 AM      Failed - HBA1C is between 0 and 7.9 and within 180 days    HbA1c, POC (controlled diabetic range)  Date Value Ref Range Status  02/02/2023 12.8 (A) 0.0 - 7.0 % Final         Failed - Valid encounter within last 6 months    Recent Outpatient Visits           1 year ago Type 2 diabetes mellitus with hyperglycemia, with long-term current use of insulin  (HCC)   [facility] [place]. [facility] Delbert Clam, MD   2 years ago Type 2 diabetes mellitus with hyperglycemia, with long-term current use of insulin  [facility])   [facility] [place]. [facility] [place], [place], [place]   2 years ago Type 2 diabetes mellitus with hyperglycemia, with long-term current use of insulin  [facility])   [facility] [place]. [facility] Delbert Clam, MD   2 years ago Type 2 diabetes mellitus with hyperglycemia, with long-term current use of insulin  [facility])   [facility] [place]. [facility] Delbert Clam, MD   3 years ago Type 2 diabetes mellitus with hyperglycemia, with long-term current use of  insulin  [facility])   [facility] [place]. [facility] Delbert Clam, MD

## 2024-05-20 ENCOUNTER — Encounter: Payer: Self-pay | Admitting: Advanced Practice Midwife

## 2024-06-14 ENCOUNTER — Ambulatory Visit: Payer: Self-pay | Attending: Family Medicine | Admitting: Family Medicine

## 2024-06-14 VITALS — BP 134/86 | HR 69 | Ht 72.0 in | Wt 210.0 lb

## 2024-06-14 DIAGNOSIS — E876 Hypokalemia: Secondary | ICD-10-CM | POA: Diagnosis not present

## 2024-06-14 DIAGNOSIS — E1169 Type 2 diabetes mellitus with other specified complication: Secondary | ICD-10-CM | POA: Diagnosis not present

## 2024-06-14 DIAGNOSIS — Z91148 Patient's other noncompliance with medication regimen for other reason: Secondary | ICD-10-CM | POA: Diagnosis not present

## 2024-06-14 DIAGNOSIS — Z794 Long term (current) use of insulin: Secondary | ICD-10-CM

## 2024-06-14 DIAGNOSIS — E785 Hyperlipidemia, unspecified: Secondary | ICD-10-CM

## 2024-06-14 DIAGNOSIS — E1165 Type 2 diabetes mellitus with hyperglycemia: Secondary | ICD-10-CM | POA: Diagnosis not present

## 2024-06-14 LAB — POCT GLYCOSYLATED HEMOGLOBIN (HGB A1C): HbA1c, POC (controlled diabetic range): 12.7 % — AB (ref 0.0–7.0)

## 2024-06-14 MED ORDER — NOVOLOG FLEXPEN 100 UNIT/ML ~~LOC~~ SOPN: 0.0000 [IU] | PEN_INJECTOR | Freq: Three times a day (TID) | SUBCUTANEOUS | 6 refills | Status: DC

## 2024-06-14 MED ORDER — INSULIN GLARGINE-YFGN 100 UNIT/ML ~~LOC~~ SOPN: 30.0000 [IU] | PEN_INJECTOR | Freq: Every day | SUBCUTANEOUS | 3 refills | Status: DC

## 2024-06-14 MED ORDER — ATORVASTATIN CALCIUM 40 MG PO TABS
40.0000 mg | ORAL_TABLET | Freq: Every day | ORAL | 1 refills | Status: DC
Start: 2024-06-14 — End: 2024-08-25

## 2024-06-14 MED ORDER — FREESTYLE LIBRE 3 SENSOR MISC
11 refills | Status: DC
Start: 2024-06-14 — End: 2024-08-25

## 2024-06-14 NOTE — Patient Instructions (Signed)
 VISIT SUMMARY:  You came in today for a follow-up on your diabetes and medication management. We discussed your current health status, including your blood sugar levels, medication adherence, and potassium levels.  YOUR PLAN:  -TYPE 2 DIABETES MELLITUS WITH HYPERGLYCEMIA: This means your blood sugar levels are too high, which can lead to complications affecting your kidneys and eyes. We recommend restarting Semglee  at 30 units at night and using Novolog  on a sliding scale. We will also order a Freestyle Libre system for continuous glucose monitoring and refer you to a pharmacist for a blood sugar review and insulin  adjustment. It's important to take your medications as prescribed and attend regular follow-ups.  -MIXED HYPERLIPIDEMIA ASSOCIATED WITH TYPE 2 DIABETES MELLITUS: This means you have high levels of cholesterol and other fats in your blood, which is common in people with diabetes. We have reordered atorvastatin  40 mg daily to help manage your cholesterol levels.  -HYPOKALEMIA, POSSIBLE RECURRENCE: This means you have low potassium levels, which can affect your overall health. We have ordered a blood test to check your potassium levels and will reassess your need for potassium supplements based on the results.  INSTRUCTIONS:  Please follow up with the pharmacist for a blood sugar review and insulin  adjustment. Make sure to get your blood test done to check your potassium levels. Restart taking Semglee  30 units at night and use Novolog  on a sliding scale as discussed. We will also set you up with a Freestyle Libre system for continuous glucose monitoring. Continue taking atorvastatin  40 mg daily for your cholesterol. It's important to adhere to your medication regimen and attend regular follow-up appointments.

## 2024-06-14 NOTE — Progress Notes (Signed)
 Subjective:  Patient ID: Louis Vargas, male    DOB: July 10, 1981  Age: 43 y.o. MRN: [id number]  CC: Medical Management of Chronic Issues     Discussed the use of AI scribe software for clinical note transcription with the patient, who gave verbal consent to proceed.  History of Present Illness Louis Vargas is a 43 year old male with a history of  type 2 diabetes mellitus (A1c 12.8), hyperlipidemia   who presents for medication management and follow-up. Last seen in the Clinic 16 months ago.  He has an A1c of 12.7 and has been inconsistent with his diabetes medication regimen. He recently obtained 70/30 insulin  without a prescription and has not been taking Semglee  regularly, with the last dose approximately a year ago. Previously, he took 30 units of Semglee  at night. He takes Novolog  three times a day with meals when available.  He has low potassium levels and has not been taking potassium supplements consistently, occasionally using his mother's potassium pills.  He uses a Livongo meter to check his blood sugar and is familiar with the [employer] system, which he believes would help monitor his blood sugar more effectively.  His job involves making parts, which has made attending regular medical appointments challenging. No specific symptoms were noted during the review of systems.    Past Medical History:  Diagnosis Date   Diabetes mellitus without complication (HCC)    Obesity (BMI 30.0-34.9)     No past surgical history on file.  Family History  Problem Relation Age of Onset   Diabetes Mother    Diabetes Sister    Diabetes Brother     Social History   Socioeconomic History   Marital status: Married    Spouse name: Dimtria   Number of children: Not on file   Years of education: Not on file   Highest education level: Not on file  Occupational History   Occupation: makes parts  Tobacco Use   Smoking status: Never   Smokeless tobacco: Never  Vaping Use    Vaping status: Never Used  Substance and Sexual Activity   Alcohol use: Yes    Comment: occ   Drug use: No   Sexual activity: Yes  Other Topics Concern   Not on file  Social History Narrative   Not on file   Social Drivers of Health   Financial Resource Strain: Low Risk  (10/02/2022)   Received from [facility]   Overall Financial Resource Strain (CARDIA)    Difficulty of Paying Living Expenses: Not hard at all  Food Insecurity: Not on file  Transportation Needs: Unknown (10/02/2022)   Received from [facility] - Transportation    Lack of Transportation (Medical): No    Lack of Transportation (Non-Medical): Not on file  Physical Activity: Not on file  Stress: No Stress Concern Present (10/02/2022)   Received from [facility] - Occupational Stress Questionnaire    Feeling of Stress : Not at all  Social Connections: Unknown (10/02/2022)   Received from [facility]   Social Network    Social Network: Not on file    Allergies  Allergen Reactions   Iodinated Contrast Media Swelling    Outpatient Medications Prior to Visit  Medication Sig Dispense Refill   potassium chloride  (KLOR-CON  10) 10 MEQ tablet Take 1 tablet (10 mEq total) by mouth daily. 90 tablet 1   atorvastatin  (LIPITOR) 40 MG tablet Take 1 tablet (40  mg total) by mouth daily. 90 tablet 0   insulin  aspart (NOVOLOG  FLEXPEN) 100 UNIT/ML FlexPen Inject 0-12 Units into the skin 3 (three) times daily with meals. Per Sliding scale (For blood sugars 0-150 give 0 units of insulin , 151-200 give 2 units of insulin , 201-250 give 4 units, 251-300 give 6 units, 301-350 give 8 units, 351-400 give 10 units,> 400 give 12 units) 30 mL 6   insulin  glargine-yfgn (SEMGLEE , YFGN,) 100 UNIT/ML Pen Inject 30 Units into the skin daily. 30 mL 3   blood glucose meter kit and supplies Dispense based on patient and insurance preference. Use up to four times daily as directed. (FOR  ICD-10 E10.9, E11.9). (Patient not taking: Reported on 06/14/2024) 1 each 0   fenofibrate  160 MG tablet Take 1 tablet (160 mg total) by mouth daily. (Patient not taking: Reported on 06/14/2024) 90 tablet 0   Insulin  Pen Needle (PEN NEEDLES 3/16) 31G X 5 MM MISC 1 each by Does not apply route daily after supper. (Patient not taking: Reported on 06/14/2024) 100 each 1   ondansetron  (ZOFRAN ) 4 MG tablet Take 1 tablet (4 mg total) by mouth every 6 (six) hours as needed for nausea. (Patient not taking: Reported on 06/14/2024) 20 tablet 0   polyethylene glycol (MIRALAX  / GLYCOLAX ) 17 g packet Take 17 g by mouth daily. (Patient not taking: Reported on 06/14/2024) 14 each 0   No facility-administered medications prior to visit.     ROS Review of Systems  Constitutional:  Negative for activity change and appetite change.  HENT:  Negative for sinus pressure and sore throat.   Respiratory:  Negative for chest tightness, shortness of breath and wheezing.   Cardiovascular:  Negative for chest pain and palpitations.  Gastrointestinal:  Negative for abdominal distention, abdominal pain and constipation.  Genitourinary: Negative.   Musculoskeletal: Negative.   Psychiatric/Behavioral:  Negative for behavioral problems and dysphoric mood.     Objective:  BP 134/86   Pulse 69   Ht 6' (1.829 m)   Wt 210 lb (95.3 kg)   SpO2 98%   BMI 28.48 kg/m      06/14/2024    3:17 PM 02/02/2023    2:51 PM 02/02/2023    2:01 PM  BP/Weight  Systolic BP 134 103 151  Diastolic BP 86 67 101  Wt. (Lbs) 210  203.2  BMI 28.48 kg/m2  27.56 kg/m2      Physical Exam Constitutional:      Appearance: He is well-developed.  Cardiovascular:     Rate and Rhythm: Normal rate.     Heart sounds: Normal heart sounds. No murmur heard. Pulmonary:     Effort: Pulmonary effort is normal.     Breath sounds: Normal breath sounds. No wheezing or rales.  Chest:     Chest wall: No tenderness.  Abdominal:     General: Bowel sounds  are normal. There is no distension.     Palpations: Abdomen is soft. There is no mass.     Tenderness: There is no abdominal tenderness.  Musculoskeletal:        General: Normal range of motion.     Right lower leg: No edema.     Left lower leg: No edema.  Neurological:     Mental Status: He is alert and oriented to person, place, and time.  Psychiatric:        Mood and Affect: Mood normal.        Latest Ref Rng & Units 06/14/2024  3:58 PM 02/02/2023    3:09 PM 04/23/2022    4:45 PM  CMP  Glucose 70 - 99 mg/dL 712  886  232   BUN 6 - 24 mg/dL 15  12  23    Creatinine 0.76 - 1.27 mg/dL 8.79  8.74  8.82   Sodium 134 - 144 mmol/L 139  144  135   Potassium 3.5 - 5.2 mmol/L 3.7  3.2  4.4   Chloride 96 - 106 mmol/L 101  105  93   CO2 20 - 29 mmol/L 25  22  22    Calcium  8.7 - 10.2 mg/dL 9.4  9.9  9.8   Total Protein 6.0 - 8.5 g/dL 7.0  6.7  6.9   Total Bilirubin 0.0 - 1.2 mg/dL 0.3  0.3  <9.7   Alkaline Phos 44 - 121 IU/L 82  67  88   AST 0 - 40 IU/L 10  10  18    ALT 0 - 44 IU/L 11  15  35     Lipid Panel     Component Value Date/Time   CHOL 336 (H) 05/15/2020 1742   TRIG 1,293 (H) 05/15/2020 1742   HDL 20 (L) 05/15/2020 1742   CHOLHDL NOT REPORTED DUE TO HIGH TRIGLYCERIDES 05/15/2020 1742   VLDL UNABLE TO CALCULATE IF TRIGLYCERIDE OVER 400 mg/dL 92/86/7978 8257   LDLCALC UNABLE TO CALCULATE IF TRIGLYCERIDE OVER 400 mg/dL 92/86/7978 8257   LDLDIRECT UNABLE TO CALCULATE IF TRIGLYCERIDE IS >1293 mg/dL 92/86/7978 8257    CBC    Component Value Date/Time   WBC 3.8 (L) 02/06/2021 0239   RBC 4.54 02/06/2021 0239   HGB 10.7 (L) 02/06/2021 0239   HCT 34.2 (L) 02/06/2021 0239   PLT 197 02/06/2021 0239   MCV 75.3 (L) 02/06/2021 0239   MCH 23.6 (L) 02/06/2021 0239   MCHC 31.3 02/06/2021 0239   RDW 13.9 02/06/2021 0239   LYMPHSABS 1.7 02/06/2021 0239   MONOABS 0.4 02/06/2021 0239   EOSABS 0.1 02/06/2021 0239   BASOSABS 0.0 02/06/2021 0239    Lab Results  Component Value  Date   HGBA1C 12.7 (A) 06/14/2024    Lab Results  Component Value Date   HGBA1C 12.7 (A) 06/14/2024   HGBA1C 12.8 (A) 02/02/2023   HGBA1C >15 04/23/2022       Assessment & Plan Type 2 diabetes mellitus with hyperglycemia/ medication non adherence Chronic type 2 diabetes with persistent hyperglycemia. A1c is 12.7. Non-adherence to medication regimen noted. Risk of complications affecting kidneys and eyes discussed. Continuous glucose monitoring with Freestyle Libre recommended for better management. - Restart Semglee  30 units at night. - Restart Novolog  on a sliding scale. - Order Freestyle Libre for continuous glucose monitoring. - Refer to pharmacist for blood sugar review and insulin  adjustment. - Educated on the importance of medication adherence and regular follow-up.  Mixed hyperlipidemia associated with type 2 diabetes mellitus Mixed hyperlipidemia associated with diabetes. Cholesterol levels not checked today due to non-fasting state. - Reorder atorvastatin  40 mg daily.  Hypokalemia, possible recurrence Hypokalemia with uncertain current status. Potassium supplementation not consistently taken. - Order blood test to check potassium levels. - Reassess potassium supplementation based on test results.      Meds ordered this encounter  Medications   atorvastatin  (LIPITOR) 40 MG tablet    Sig: Take 1 tablet (40 mg total) by mouth daily.    Dispense:  90 tablet    Refill:  1   insulin  aspart (NOVOLOG  FLEXPEN) 100 UNIT/ML FlexPen  Sig: Inject 0-12 Units into the skin 3 (three) times daily with meals. Per Sliding scale (For blood sugars 0-150 give 0 units of insulin , 151-200 give 2 units of insulin , 201-250 give 4 units, 251-300 give 6 units, 301-350 give 8 units, 351-400 give 10 units,> 400 give 12 units)    Dispense:  30 mL    Refill:  6    For blood sugars 0-150 give 0 units of insulin , 151-200 give 2 units of insulin , 201-250 give 4 units, 251-300 give 6 units,  301-350 give 8 units, 351-400 give 10 units,> 400 give 12 units   insulin  glargine-yfgn (SEMGLEE , YFGN,) 100 UNIT/ML Pen    Sig: Inject 30 Units into the skin daily.    Dispense:  30 mL    Refill:  3   Continuous Glucose Sensor (FREESTYLE LIBRE 3 SENSOR) MISC    Sig: Place 1 sensor on the skin every 14 days. Use to check glucose continuously    Dispense:  3 each    Refill:  11    Follow-up: Return in about 1 month (around 07/15/2024) for Blood sugar evaluation with Herlene, Medical conditions with PCP, in 3 months.       Corrina Sabin, MD, FAAFP. [facility] [place], [place] [phone]   06/15/2024, 6:39 PM

## 2024-06-15 ENCOUNTER — Encounter: Payer: Self-pay | Admitting: Family Medicine

## 2024-06-16 ENCOUNTER — Ambulatory Visit: Payer: Self-pay | Admitting: Family Medicine

## 2024-06-16 LAB — CMP14+EGFR
ALT: 11 IU/L (ref 0–44)
AST: 10 IU/L (ref 0–40)
Albumin: 4.1 g/dL (ref 4.1–5.1)
Alkaline Phosphatase: 82 IU/L (ref 44–121)
BUN/Creatinine Ratio: 13 (ref 9–20)
BUN: 15 mg/dL (ref 6–24)
Bilirubin Total: 0.3 mg/dL (ref 0.0–1.2)
CO2: 25 mmol/L (ref 20–29)
Calcium: 9.4 mg/dL (ref 8.7–10.2)
Chloride: 101 mmol/L (ref 96–106)
Creatinine, Ser: 1.2 mg/dL (ref 0.76–1.27)
Globulin, Total: 2.9 g/dL (ref 1.5–4.5)
Glucose: 287 mg/dL — ABNORMAL HIGH (ref 70–99)
Potassium: 3.7 mmol/L (ref 3.5–5.2)
Sodium: 139 mmol/L (ref 134–144)
Total Protein: 7 g/dL (ref 6.0–8.5)
eGFR: 76 mL/min/1.73 (ref >59)

## 2024-06-16 LAB — MICROALBUMIN / CREATININE URINE RATIO
Creatinine, Urine: 78.7 mg/dL
Microalb/Creat Ratio: 27 mg/g{creat} (ref 0–29)
Microalbumin, Urine: 20.9 ug/mL

## 2024-06-29 ENCOUNTER — Encounter: Admitting: Family Medicine

## 2024-07-22 ENCOUNTER — Telehealth: Payer: Self-pay | Admitting: Family Medicine

## 2024-07-22 NOTE — Telephone Encounter (Signed)
 Lvm to confirm appt for 9/22

## 2024-07-24 NOTE — Progress Notes (Unsigned)
 S:     No chief complaint on file.  43 y.o. male who presents for diabetes evaluation, education, and management. PMH is significant for T2DM, hyperlipidemia. Patient was referred and last seen by Primary Care Provider, Dr. Delbert, on 06/14/24. At last visit, his A1c was 12.7%. He reported inconsistent medication use. He was prescribed Semglee , but was using Novolin  70/30 instead with Novolog  TID with meals when available. Checks BG at home Livongo meter. Dr. Newlin started him on a Libre CGM, Semglee  30 units nightly, and Novolog  sliding scale    Patient arrives in *** good spirits and presents without *** any assistance. ***Patient is accompanied by ***.  Tolerating insulins? How much insulin  with meals? Hypo? Hyper? Checking BG at home? Home readings? Diet and exercise?   Family/Social History: ***  Current diabetes medications include: Semglee  30 units daily, Novolog  0-12 units TID with meals and sliding scale Current hypertension medications include: *** Current hyperlipidemia medications include: atorvastatin  40 mg daily  Patient reports adherence to taking all medications as prescribed.  *** Patient denies adherence with medications, reports missing *** medications *** times per week, on average.  Do you feel that your medications are working for you? {YES NO:22349} Have you been experiencing any side effects to the medications prescribed? {YES NO:22349} Do you have any problems obtaining medications due to transportation or finances? {YES [id number] Insurance coverage: ***  Patient {Actions; denies-reports:120008} hypoglycemic events.  Reported home fasting blood sugars: ***  Reported 2 hour post-meal/random blood sugars: ***.  Patient {Actions; denies-reports:120008} nocturia (nighttime urination).  Patient {Actions; denies-reports:120008} neuropathy (nerve pain). Patient {Actions; denies-reports:120008} visual changes. Patient {Actions; denies-reports:120008} self  foot exams.   Patient reported dietary habits: Eats *** meals/day Breakfast: *** Lunch: *** Dinner: *** Snacks: *** Drinks: ***  Within the past 12 months, did you worry whether your food would run out before you got money to buy more? {YES NO:22349} Within the past 12 months, did the food you bought run out, and you didn't have money to get more? {YES NO:22349} PHQ-9 Score: ***  Patient-reported exercise habits: ***  O:   ROS  Physical Exam  7 day average blood glucose: ***  Libre3 CGM Download today *** % Time CGM is active: ***% Average Glucose: *** mg/dL Glucose Management Indicator: ***  Glucose Variability: ***% (goal <36%) Time in Goal:  - Time in range 70-180: ***% - Time above range: ***% - Time below range: ***% Observed patterns:  Lab Results  Component Value Date   HGBA1C 12.7 (A) 06/14/2024   There were no vitals filed for this visit.  Lipid Panel     Component Value Date/Time   CHOL 336 (H) 05/15/2020 1742   TRIG 1,293 (H) 05/15/2020 1742   HDL 20 (L) 05/15/2020 1742   CHOLHDL NOT REPORTED DUE TO HIGH TRIGLYCERIDES 05/15/2020 1742   VLDL UNABLE TO CALCULATE IF TRIGLYCERIDE OVER 400 mg/dL 92/86/7978 8257   LDLCALC UNABLE TO CALCULATE IF TRIGLYCERIDE OVER 400 mg/dL 92/86/7978 8257   LDLDIRECT UNABLE TO CALCULATE IF TRIGLYCERIDE IS >1293 mg/dL 92/86/7978 8257    Clinical Atherosclerotic Cardiovascular Disease (ASCVD): {YES/NO:21197} The ASCVD Risk score (Arnett DK, et al., 2019) failed to calculate for the following reasons:   Cannot find a previous HDL lab   Cannot find a previous total cholesterol lab   Patient is participating in a Managed Medicaid Plan:  {MM YES/NO:27447::Yes}   A/P: Diabetes longstanding *** currently ***. Patient is *** able to verbalize appropriate hypoglycemia management plan.  Medication adherence appears ***. Control is suboptimal due to ***. -{Meds adjust:18428} basal insulin  *** Lantus /Basaglar /Semglee  (insulin   glargine) *** Tresiba (insulin  degludec) from *** units to *** units daily in the morning. Patient will continue to titrate 1 unit every *** days if fasting blood sugar > 100mg /dl until fasting blood sugars reach goal or next visit.  -{Meds adjust:18428} rapid insulin  *** Novolog  (insulin  aspart) *** Humalog (insulin  lispro) from *** to ***.  -{Meds adjust:18428} GLP-1 *** Trulicity (dulaglutide) *** Ozempic (semaglutide) *** Mounjaro (tirzepatide) from *** mg to *** mg .  -{Meds adjust:18428} SGLT2-I *** Farxiga (dapagliflozin) *** Jardiance (empagliflozin) 10 mg. Counseled on sick day rules. -{Meds adjust:18428} metformin  ***.  -Patient educated on purpose, proper use, and potential adverse effects of ***.  -Extensively discussed pathophysiology of diabetes, recommended lifestyle interventions, dietary effects on blood sugar control.  -Counseled on s/sx of and management of hypoglycemia.  -Next A1c anticipated ***.   ASCVD risk - primary ***secondary prevention in patient with diabetes. Last LDL is *** not at goal of <29 *** mg/dL. ASCVD risk factors include *** and 10-year ASCVD risk score of ***. {Desc; low/moderate/high:110033} intensity statin indicated.  -{Meds adjust:18428} ***statin *** mg.   Hypertension longstanding *** currently ***. Blood pressure goal of <130/80 *** mmHg. Medication adherence ***. Blood pressure control is suboptimal due to ***. -{Meds adjust:18428} *** mg.  Written patient instructions provided. Patient verbalized understanding of treatment plan.  Total time in face to face counseling *** minutes.    Follow-up:  Pharmacist *** PCP clinic visit in *** Patient seen with ***

## 2024-07-25 ENCOUNTER — Ambulatory Visit: Admitting: Pharmacist

## 2024-07-25 NOTE — Telephone Encounter (Signed)
 Called patient, Patient appointment scheduled for 08/25/2024 at 1:30 pm.

## 2024-07-25 NOTE — Telephone Encounter (Signed)
 Copied from CRM [id number]. Topic: Appointments - Scheduling Inquiry for Clinic >> Jul 25, 2024  4:09 PM Myrick T wrote:  Reason for CRM: patient called stated he overslept and would like to reschedule. Please f/u with patient

## 2024-08-25 ENCOUNTER — Encounter: Payer: Self-pay | Admitting: Pharmacist

## 2024-08-25 ENCOUNTER — Ambulatory Visit: Attending: Family Medicine | Admitting: Pharmacist

## 2024-08-25 ENCOUNTER — Other Ambulatory Visit: Payer: Self-pay

## 2024-08-25 DIAGNOSIS — E1165 Type 2 diabetes mellitus with hyperglycemia: Secondary | ICD-10-CM | POA: Diagnosis not present

## 2024-08-25 DIAGNOSIS — Z794 Long term (current) use of insulin: Secondary | ICD-10-CM

## 2024-08-25 DIAGNOSIS — E1169 Type 2 diabetes mellitus with other specified complication: Secondary | ICD-10-CM

## 2024-08-25 DIAGNOSIS — E785 Hyperlipidemia, unspecified: Secondary | ICD-10-CM

## 2024-08-25 MED ORDER — ATORVASTATIN CALCIUM 40 MG PO TABS
40.0000 mg | ORAL_TABLET | Freq: Every day | ORAL | 1 refills | Status: AC
  Filled 2024-08-25: qty 30, 30d supply, fill #0

## 2024-08-25 MED ORDER — LANTUS SOLOSTAR 100 UNIT/ML ~~LOC~~ SOPN
30.0000 [IU] | PEN_INJECTOR | Freq: Every day | SUBCUTANEOUS | 1 refills | Status: AC
  Filled 2024-08-25: qty 6, 20d supply, fill #0

## 2024-08-25 MED ORDER — DEXCOM G7 RECEIVER DEVI
0 refills | Status: AC
  Filled 2024-08-25: qty 1, fill #0

## 2024-08-25 MED ORDER — NOVOLOG FLEXPEN 100 UNIT/ML ~~LOC~~ SOPN
PEN_INJECTOR | SUBCUTANEOUS | 11 refills | Status: AC
  Filled 2024-08-25: qty 9, 25d supply, fill #0

## 2024-08-25 MED ORDER — PEN NEEDLES 32G X 4 MM MISC
6 refills | Status: AC
  Filled 2024-08-25: qty 100, 25d supply, fill #0

## 2024-08-25 MED ORDER — DEXCOM G7 SENSOR MISC
6 refills | Status: AC
  Filled 2024-08-25: qty 3, fill #0
  Filled 2024-08-25: qty 3, 30d supply, fill #0

## 2024-08-25 MED ORDER — ONDANSETRON HCL 4 MG PO TABS
4.0000 mg | ORAL_TABLET | Freq: Four times a day (QID) | ORAL | 0 refills | Status: AC | PRN
  Filled 2024-08-25: qty 20, 5d supply, fill #0

## 2024-08-25 NOTE — Progress Notes (Signed)
 S:     No chief complaint on file.  43 y.o. male who presents for diabetes evaluation, education, and management. Patient arrives good spirits and presents without any assistance.   Patient was referred and last seen by Primary Care Provider, Dr. Delbert, on 06/14/2024. At that visit, A1c was 12.7. He admitted to non-adherence to his insulin  regimen. Dr. Newlin had him restart Semglee  and Novolog . We also got him started on [facility].  PMH is significant for T1DM w/ a hx of DKA, hyperlipidemia, obesity, hx of AKI. Patient reports Diabetes is longstanding. Unfortunately, he has a deductible that is unmet. Therefore, rxn copays are really expensive. For example, his Dexcom G7 sensors and receiver are covered but are $275. HiNovolog  is covered but is $81.53 for a 25-day supply. Additionally, his Semglee  is >$100 for a month's supply.   Family/Social History:  Fhx: DM Tobacco: never smoker Alcohol: none reported   Current diabetes medications include: Semglee  30 units daily, Novolog  per sliding scale - not taking either due to cost.  **Still taking 70/30 OTC vial/syringe insulin  - takes 30 units BID Current hypertension medications include: none Current hyperlipidemia medications include: atorvastatin  40 mg daily  Insurance coverage: BCBS  Patient denies hypoglycemic events.  Reported home fasting blood sugars: 200s-500s   Patient reports nocturia (nighttime urination).  Patient reports neuropathy (nerve pain). Patient reports visual changes. Patient reports self foot exams.   Patient reported dietary habits:  - Works 3rd shift.  - Drinks regular soda. Does not regular meals but snacks mostly.   Patient-reported exercise habits: active at work   O:   Lab Results  Component Value Date   HGBA1C 12.7 (A) 06/14/2024   There were no vitals filed for this visit.  Lipid Panel     Component Value Date/Time   CHOL 336 (H) 05/15/2020 1742   TRIG 1,293 (H) 05/15/2020  1742   HDL 20 (L) 05/15/2020 1742   CHOLHDL NOT REPORTED DUE TO HIGH TRIGLYCERIDES 05/15/2020 1742   VLDL UNABLE TO CALCULATE IF TRIGLYCERIDE OVER 400 mg/dL 92/86/7978 8257   LDLCALC UNABLE TO CALCULATE IF TRIGLYCERIDE OVER 400 mg/dL 92/86/7978 8257   LDLDIRECT UNABLE TO CALCULATE IF TRIGLYCERIDE IS >1293 mg/dL 92/86/7978 8257    Clinical Atherosclerotic Cardiovascular Disease (ASCVD): No  The ASCVD Risk score (Arnett DK, et al., 2019) failed to calculate for the following reasons:   Cannot find a previous HDL lab   Cannot find a previous total cholesterol lab   Patient is participating in a Managed Medicaid Plan: No   A/P: Diabetes longstanding currently uncontrolled. He is having to use suboptimal insulin  due to cost constraints. We will have him fill here. Lantus  at our pharmacy with an e-voucher is $35 for a 20-day supply. I signed him up for a Novolog  copay card and his copay is $35 for a 25-day supply. Even with these savings, he does not have the money to pick these up today. I'm going to have him stop by our pharmacy to see if he can place these copays on a charge account to pay later.  -Discontinue 70/30.  -Start Lantus  30 units daily.  -Start Novolog  per sliding scale.  -Patient educated on purpose, proper use, and potential adverse effects of Lantus , Novolog .  -Extensively discussed pathophysiology of diabetes, recommended lifestyle interventions, dietary effects on blood sugar control.  -Counseled on s/sx of and management of hypoglycemia.  -Next A1c anticipated 09/2024.   Written patient instructions provided. Patient verbalized understanding of  treatment plan.  Total time in face to face counseling 30 minutes.    Follow-up:  Pharmacist in 2 months.  PCP clinic visit in 2-3 weeks.   Herlene Fleeta Morris, PharmD, JAQUELINE, CPP Clinical Pharmacist [facility] [phone]

## 2024-09-14 ENCOUNTER — Encounter: Payer: Self-pay | Admitting: Family Medicine

## 2024-09-14 ENCOUNTER — Ambulatory Visit: Payer: Self-pay | Attending: Family Medicine | Admitting: Family Medicine

## 2024-09-14 VITALS — BP 141/91 | HR 73 | Temp 98.2°F | Ht 72.0 in | Wt 211.8 lb

## 2024-09-14 DIAGNOSIS — E1165 Type 2 diabetes mellitus with hyperglycemia: Secondary | ICD-10-CM | POA: Diagnosis not present

## 2024-09-14 DIAGNOSIS — Z794 Long term (current) use of insulin: Secondary | ICD-10-CM

## 2024-09-14 LAB — POCT GLYCOSYLATED HEMOGLOBIN (HGB A1C): HbA1c, POC (controlled diabetic range): 13.1 % — AB (ref 0.0–7.0)

## 2024-09-14 LAB — GLUCOSE, POCT (MANUAL RESULT ENTRY): POC Glucose: 125 mg/dL — AB (ref 70–99)

## 2024-09-14 NOTE — Progress Notes (Signed)
 Subjective:  Patient ID: Louis Vargas, male    DOB: Dec 13, 1980  Age: 43 y.o. MRN: [id number]  CC: Medical Management of Chronic Issues     Discussed the use of AI scribe software for clinical note transcription with the patient, who gave verbal consent to proceed.  History of Present Illness Louis Vargas is a 43 year old male with type 2 diabetes mellitus, hyperlipidemia who presents for follow-up on his diabetes management.  He takes 30 units of Semglee  in the morning and 30 units of Novolog  when he feels his blood sugar is elevated at the same time, but he does not check his blood sugar levels at all. He consumes a lot of sodas and eats little, which he believes affects his blood sugar.  He should be on a sliding scale of NovoLog  however he has not been adhering to that.  He administers NovoLog  30 units once a day in the morning with his Semglee .  His A1c is 13.1, an increase from 12.7. He had not been taking Semglee  due to cost but recently obtained it at a more affordable price late last month. Previously, he used 25 units of 70/30 insulin  three times daily, which was the only affordable option.  The dose of his 70/30 is something he just came up with. He experiences weakness or dizziness after taking his medication. He felt lightheaded at work around 5 AM after taking his medication. He usually administers insulin  at 9 AM.  He is inconsistent in his history as he does tell me he administers insulin  at 9 AM but then goes on to say he felt lightheaded at 5 AM. He was seen by the clinical pharmacist for management of his diabetes last month.  He has not consulted a nutritionist.    Past Medical History:  Diagnosis Date   Diabetes mellitus without complication (HCC)    Obesity (BMI 30.0-34.9)     No past surgical history on file.  Family History  Problem Relation Age of Onset   Diabetes Mother    Diabetes Sister    Diabetes Brother     Social History   Socioeconomic History    Marital status: Married    Spouse name: Dimtria   Number of children: Not on file   Years of education: Not on file   Highest education level: Not on file  Occupational History   Occupation: makes parts  Tobacco Use   Smoking status: Never   Smokeless tobacco: Never  Vaping Use   Vaping status: Never Used  Substance and Sexual Activity   Alcohol use: Yes    Comment: occ   Drug use: No   Sexual activity: Yes  Other Topics Concern   Not on file  Social History Narrative   Not on file   Social Drivers of Health   Financial Resource Strain: Low Risk  (10/02/2022)   Received from [facility]   Overall Financial Resource Strain (CARDIA)    Difficulty of Paying Living Expenses: Not hard at all  Food Insecurity: Not on file  Transportation Needs: Unknown (10/02/2022)   Received from [facility] - Transportation    Lack of Transportation (Medical): No    Lack of Transportation (Non-Medical): Not on file  Physical Activity: Not on file  Stress: No Stress Concern Present (10/02/2022)   Received from [facility] - Occupational Stress Questionnaire    Feeling of Stress : Not at all  Social Connections:  Unknown (10/02/2022)   [facility] from [facility]   Social Network    Social Network: Not on file    Allergies  Allergen Reactions   Iodinated Contrast Media Swelling    Outpatient Medications Prior to Visit  Medication Sig Dispense Refill   atorvastatin  (LIPITOR) 40 MG tablet Take 1 tablet (40 mg total) by mouth daily. 90 tablet 1   Continuous Glucose Receiver (DEXCOM G7 RECEIVER) DEVI Use to check blood glucose continuously. 1 each 0   Continuous Glucose Sensor (DEXCOM G7 SENSOR) MISC Use to check blood glucose throughout the day. Changes sensors once every 10 days. 3 each 6   insulin  aspart (NOVOLOG  FLEXPEN) 100 UNIT/ML FlexPen Inject 0-12 Units into the skin 3 (three) times daily with meals. Per Sliding scale  (For blood sugars 0-150 give 0 units of insulin , 151-200 give 2 units of insulin , 201-250 give 4 units, 251-300 give 6 units, 301-350 give 8 units, 351-400 give 10 units,> 400 give 12 units) 15 mL 11   insulin  glargine (LANTUS  SOLOSTAR) 100 UNIT/ML Solostar Pen Inject 30 Units into the skin daily. 15 mL 1   Insulin  Pen Needle (PEN NEEDLES) 32G X 4 MM MISC Use to inject insulin  a combined 4 times daily. 100 each 6   ondansetron  (ZOFRAN ) 4 MG tablet Take 1 tablet (4 mg total) by mouth every 6 (six) hours as needed for nausea. 20 tablet 0   fenofibrate  160 MG tablet Take 1 tablet (160 mg total) by mouth daily. (Patient not taking: Reported on 09/14/2024) 90 tablet 0   polyethylene glycol (MIRALAX  / GLYCOLAX ) 17 g packet Take 17 g by mouth daily. (Patient not taking: Reported on 09/14/2024) 14 each 0   No facility-administered medications prior to visit.     ROS Review of Systems  Constitutional:  Negative for activity change and appetite change.  HENT:  Negative for sinus pressure and sore throat.   Respiratory:  Negative for chest tightness, shortness of breath and wheezing.   Cardiovascular:  Negative for chest pain and palpitations.  Gastrointestinal:  Negative for abdominal distention, abdominal pain and constipation.  Genitourinary: Negative.   Musculoskeletal: Negative.   Psychiatric/Behavioral:  Negative for behavioral problems and dysphoric mood.     Objective:  BP (!) 141/91   Pulse 73   Temp 98.2 F (36.8 C) (Oral)   Ht 6' (1.829 m)   Wt 211 lb 12.8 oz (96.1 kg)   SpO2 97%   BMI 28.73 kg/m      09/14/2024    3:49 PM 09/14/2024    3:16 PM 06/14/2024    3:17 PM  BP/Weight  Systolic BP 141 138 134  Diastolic BP 91 90 86  Wt. (Lbs)  211.8 210  BMI  28.73 kg/m2 28.48 kg/m2      Physical Exam Constitutional:      Appearance: He is well-developed.  Cardiovascular:     Rate and Rhythm: Normal rate.     Heart sounds: Normal heart sounds. No murmur heard. Pulmonary:      Effort: Pulmonary effort is normal.     Breath sounds: Normal breath sounds. No wheezing or rales.  Chest:     Chest wall: No tenderness.  Abdominal:     General: Bowel sounds are normal. There is no distension.     Palpations: Abdomen is soft. There is no mass.     Tenderness: There is no abdominal tenderness.  Musculoskeletal:        General: Normal range of motion.  Right lower leg: No edema.     Left lower leg: No edema.  Neurological:     Mental Status: He is alert and oriented to person, place, and time.  Psychiatric:        Mood and Affect: Mood normal.        Latest Ref Rng & Units 06/14/2024    3:58 PM 02/02/2023    3:09 PM 04/23/2022    4:45 PM  CMP  Glucose 70 - 99 mg/dL 712  886  232   BUN 6 - 24 mg/dL 15  12  23    Creatinine 0.76 - 1.27 mg/dL 8.79  8.74  8.82   Sodium 134 - 144 mmol/L 139  144  135   Potassium 3.5 - 5.2 mmol/L 3.7  3.2  4.4   Chloride 96 - 106 mmol/L 101  105  93   CO2 20 - 29 mmol/L 25  22  22    Calcium  8.7 - 10.2 mg/dL 9.4  9.9  9.8   Total Protein 6.0 - 8.5 g/dL 7.0  6.7  6.9   Total Bilirubin 0.0 - 1.2 mg/dL 0.3  0.3  <9.7   Alkaline Phos 44 - 121 IU/L 82  67  88   AST 0 - 40 IU/L 10  10  18    ALT 0 - 44 IU/L 11  15  35     Lipid Panel     Component Value Date/Time   CHOL 336 (H) 05/15/2020 1742   TRIG 1,293 (H) 05/15/2020 1742   HDL 20 (L) 05/15/2020 1742   CHOLHDL NOT REPORTED DUE TO HIGH TRIGLYCERIDES 05/15/2020 1742   VLDL UNABLE TO CALCULATE IF TRIGLYCERIDE OVER 400 mg/dL 92/86/7978 8257   LDLCALC UNABLE TO CALCULATE IF TRIGLYCERIDE OVER 400 mg/dL 92/86/7978 8257   LDLDIRECT UNABLE TO CALCULATE IF TRIGLYCERIDE IS >1293 mg/dL 92/86/7978 8257    CBC    Component Value Date/Time   WBC 3.8 (L) 02/06/2021 0239   RBC 4.54 02/06/2021 0239   HGB 10.7 (L) 02/06/2021 0239   HCT 34.2 (L) 02/06/2021 0239   PLT 197 02/06/2021 0239   MCV 75.3 (L) 02/06/2021 0239   MCH 23.6 (L) 02/06/2021 0239   MCHC 31.3 02/06/2021 0239   RDW  13.9 02/06/2021 0239   LYMPHSABS 1.7 02/06/2021 0239   MONOABS 0.4 02/06/2021 0239   EOSABS 0.1 02/06/2021 0239   BASOSABS 0.0 02/06/2021 0239    Lab Results  Component Value Date   HGBA1C 13.1 (A) 09/14/2024    Lab Results  Component Value Date   HGBA1C 13.1 (A) 09/14/2024   HGBA1C 12.7 (A) 06/14/2024   HGBA1C 12.8 (A) 02/02/2023       Assessment & Plan Type 2 diabetes mellitus with hyperglycemia Poorly controlled with A1c of 13.1.  Non-adherence to insulin  and diet, including high soda intake, contributes to hyperglycemia.  Dizziness and weakness likely from incorrect insulin  dosing. Medication cost issues have improved since a new plan was set up, and the patient reports no current problems affording medication. -Poor comprehension significantly impacting his management - Maintain Semglee  at 30 units and I have emphasized that NovoLog  sliding scale should be used 3 times daily only before meals - Continue sliding scale for Novolog  based on pre-meal blood glucose. -For blood sugars 0-150 give 0 units of insulin , 151-200 give 2 units of insulin , 201-250 give 4 units, 251-300 give 6 units, 301-350 give 8 units, 351-400 give 10 units,> 400 give 12 units and call M.D. Discussed hypoglycemia  protocol. - Referred to endocrinologist for diabetes management. - Referred to nutritionist for dietary management. - Instructed to check blood glucose before meals and adjust Novolog  accordingly.   Hyperlipidemia associated with type 2 diabetes mellitus -Uncontrolled - He is due for lipid panel - Remains on atorvastatin  - Low-cholesterol diet      No orders of the defined types were placed in this encounter.   Follow-up: Return in about 3 months (around 12/15/2024).       Corrina Sabin, MD, FAAFP. [facility] [place], [place] [phone]   09/14/2024, 4:16 PM

## 2024-09-14 NOTE — Patient Instructions (Signed)
 VISIT SUMMARY:  Today, we discussed your diabetes management and made some adjustments to your treatment plan. Your recent A1c level was 13.1, indicating that your blood sugar levels are not well controlled. We talked about your current insulin  regimen, diet, and the importance of checking your blood sugar levels regularly.  YOUR PLAN:  -TYPE 2 DIABETES MELLITUS WITH HYPERGLYCEMIA: Type 2 diabetes is a condition where your body does not use insulin  properly, leading to high blood sugar levels. Your A1c level is 13.1, which shows that your blood sugar is not well controlled. We have increased your Semglee  dose to 40 units daily and advised you to continue using Novolog  based on your blood sugar levels before meals. You have been referred to an endocrinologist for specialized diabetes management and to a nutritionist for help with your diet. It is important to check your blood sugar before meals and adjust your Novolog  dose accordingly.  INSTRUCTIONS:  Please follow up with the endocrinologist and nutritionist as referred. Make sure to check your blood sugar levels before meals and adjust your Novolog  dose based on those readings. Continue taking 40 units of Semglee  daily. Your next appointment with [facility] for medication access assistance is next month.

## 2024-10-14 ENCOUNTER — Telehealth: Payer: Self-pay | Admitting: Family Medicine

## 2024-10-14 ENCOUNTER — Ambulatory Visit: Admitting: Pharmacist

## 2024-10-14 NOTE — Telephone Encounter (Signed)
 Copied from CRM [id number]. Topic: Appointments - Appointment Cancel/Reschedule >> Oct 14, 2024  1:57 PM Louis Vargas wrote:  Patient/patient representative is calling to cancel or reschedule an appointment. Refer to attachments for appointment information.   Patient is not feeling well and would like to fully cancel  ----------------------------------------------------------------------- From previous Reason for Contact - Scheduling Inquiry for Clinic: Reason for CRM:

## 2024-10-14 NOTE — Progress Notes (Deleted)
°  Start: *** end: ***  Patient is here today ***. Patient would like to learn ***. Patient lives with ***.  *** shopping and cooking. Pt reports eating out *** times weekly.  Pt reports making the following changes including ***.  All Pt's questions were answered during this encounter.    History includes:  *** Medications include:  *** Labs noted:  ***   13.1, lantus , novolog , G7 No questionnaires on file.

## 2024-10-14 NOTE — Telephone Encounter (Signed)
 Contacted patient, Appointment rescheduled.

## 2024-10-19 ENCOUNTER — Encounter: Admitting: Dietician

## 2024-10-19 DIAGNOSIS — E1165 Type 2 diabetes mellitus with hyperglycemia: Secondary | ICD-10-CM

## 2024-10-21 ENCOUNTER — Other Ambulatory Visit (HOSPITAL_COMMUNITY): Payer: Self-pay

## 2024-10-21 ENCOUNTER — Ambulatory Visit: Admitting: Pharmacist

## 2024-12-19 ENCOUNTER — Ambulatory Visit: Admitting: Family Medicine
